# Patient Record
Sex: Male | Born: 1950 | Race: White | Hispanic: Yes | Marital: Married | Smoking: Current every day smoker
Health system: Southern US, Community
[De-identification: ages and names within clinical notes are randomized; demographics above are authoritative.]

## PROBLEM LIST (undated history)

## (undated) DIAGNOSIS — I10 Essential (primary) hypertension: Secondary | ICD-10-CM

## (undated) DIAGNOSIS — E119 Type 2 diabetes mellitus without complications: Secondary | ICD-10-CM

## (undated) HISTORY — PX: OTHER SURGICAL HISTORY: SHX169

## (undated) HISTORY — DX: Type 2 diabetes mellitus without complications: E11.9

## (undated) HISTORY — DX: Essential (primary) hypertension: I10

---

## 2009-09-01 ENCOUNTER — Ambulatory Visit (HOSPITAL_COMMUNITY): Payer: Self-pay | Admitting: Psychiatry

## 2009-10-13 ENCOUNTER — Ambulatory Visit (HOSPITAL_COMMUNITY): Payer: Self-pay | Admitting: Psychiatry

## 2010-05-01 ENCOUNTER — Ambulatory Visit (HOSPITAL_COMMUNITY): Payer: Self-pay | Admitting: Psychiatry

## 2010-07-30 ENCOUNTER — Encounter (HOSPITAL_COMMUNITY): Payer: Self-pay | Admitting: Psychiatry

## 2010-09-16 ENCOUNTER — Encounter (INDEPENDENT_AMBULATORY_CARE_PROVIDER_SITE_OTHER): Payer: Medicaid Other | Admitting: Psychiatry

## 2010-09-16 DIAGNOSIS — F319 Bipolar disorder, unspecified: Secondary | ICD-10-CM

## 2010-11-24 ENCOUNTER — Encounter (HOSPITAL_COMMUNITY): Payer: Medicaid Other | Admitting: Psychiatry

## 2011-01-05 ENCOUNTER — Encounter (INDEPENDENT_AMBULATORY_CARE_PROVIDER_SITE_OTHER): Payer: Medicaid Other | Admitting: Psychiatry

## 2011-01-05 DIAGNOSIS — F319 Bipolar disorder, unspecified: Secondary | ICD-10-CM

## 2011-03-08 ENCOUNTER — Encounter (HOSPITAL_COMMUNITY): Payer: Self-pay

## 2011-04-07 ENCOUNTER — Encounter (HOSPITAL_COMMUNITY): Payer: Medicaid Other | Admitting: Psychiatry

## 2011-04-23 ENCOUNTER — Ambulatory Visit (INDEPENDENT_AMBULATORY_CARE_PROVIDER_SITE_OTHER): Payer: Medicaid Other | Admitting: Psychiatry

## 2011-04-23 VITALS — BP 128/82 | Ht 65.0 in | Wt 154.0 lb

## 2011-04-23 DIAGNOSIS — F329 Major depressive disorder, single episode, unspecified: Secondary | ICD-10-CM
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
## 2011-05-30 ENCOUNTER — Other Ambulatory Visit (HOSPITAL_COMMUNITY): Payer: Self-pay | Admitting: Psychiatry

## 2011-07-23 ENCOUNTER — Ambulatory Visit (HOSPITAL_COMMUNITY): Payer: Self-pay | Admitting: Psychiatry

## 2011-09-28 ENCOUNTER — Other Ambulatory Visit (HOSPITAL_COMMUNITY): Payer: Self-pay | Admitting: Psychiatry

## 2011-10-07 ENCOUNTER — Other Ambulatory Visit (HOSPITAL_COMMUNITY): Payer: Self-pay | Admitting: Psychiatry

## 2011-10-23 ENCOUNTER — Other Ambulatory Visit (HOSPITAL_COMMUNITY): Payer: Self-pay | Admitting: Psychiatry

## 2011-10-26 ENCOUNTER — Encounter (HOSPITAL_COMMUNITY): Payer: Self-pay | Admitting: Psychiatry

## 2011-10-26 ENCOUNTER — Ambulatory Visit (INDEPENDENT_AMBULATORY_CARE_PROVIDER_SITE_OTHER): Payer: Medicaid Other | Admitting: Psychiatry

## 2011-10-26 VITALS — BP 122/84 | Ht 65.0 in | Wt 142.0 lb

## 2011-10-26 DIAGNOSIS — F331 Major depressive disorder, recurrent, moderate: Secondary | ICD-10-CM | POA: Insufficient documentation

## 2011-10-26 DIAGNOSIS — F329 Major depressive disorder, single episode, unspecified: Secondary | ICD-10-CM

## 2011-10-26 NOTE — Progress Notes (Signed)
   [facility] Behavioral Health Follow-up Outpatient Visit  Adeyemi Hamad 1951/01/11   Subjective: The patient is a 31 or 61 year old male who has been followed by [facility] since August of 2012. 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 I have not seen him since November. The patient reports that since his last appointment, hence he was referred to a new pain physician. He received injections in his back. It sounds like he had a reaction to it. The patient reports he's not able to keep food down. He's been vomiting a lot. He is actually down 12 pounds since I saw him 6 months ago. Patient has been referred to a new pain physician along with what sounds like a GI physician. They're planning on doing a colonoscopy. He reports that his 64 year old daughter returned to the house and took his 77-year-old grandchild. They're not allow contact. He reports that she said she only took her because the grandparents want her to much. His son is still in the household and still drinking. His 86 year old daughter is still acting out. He reports that neither one of them talked to him. The patient reports that recently when he gets sad or when he gets mad his head will shake. He states that other people have noticed it, but he and his wife never discussed it. He reports his been going on for about 4 or 5 months. He states that if he can calm himself down, his head will stop shaking. The patient is reportedly on Cymbalta and Vistaril from an outside physician. This only showed up in the records after patient had left his appointment. We discussed the possibility of an antidepressant, but the patient would like to wait. I'm relieved that he didn't since he is already on the Cymbalta. He reports that he is compliant with the Depakote. He's not sure if it's a medication that is making her mom.  Filed Vitals:   10/26/11 1444  BP: 122/84   Mental Status  Examination  Appearance: Casually dressed, very thin  Alert: Yes Attention: good  Cooperative: Yes Eye Contact: Good Speech: Soft with strong accent Psychomotor Activity: Decreased Memory/Concentration: Intact Oriented: person, place, time/date and situation Mood: Depressed Affect: Restricted Thought Processes and Associations: Logical Fund of Knowledge: Fair Thought Content: No suicidal or homicidal thoughts Insight: Fair Judgement: Fair  Diagnosis: Maj. depressive disorder, recurrent, moderate  Treatment Plan: At this point we will continue the patient on his Depakote ER. I will see him back in 2 months. Patient to update me on the results of his colonoscopy. I have offered him an interpreter. He feels that we're doing fine.  Jamse Mead, MD

## 2011-11-10 ENCOUNTER — Other Ambulatory Visit: Payer: Self-pay | Admitting: Sports Medicine

## 2011-11-10 ENCOUNTER — Ambulatory Visit
Admission: RE | Admit: 2011-11-10 | Discharge: 2011-11-10 | Disposition: A | Discharge status: Home / Self Care | Payer: Medicaid Other | Source: Ambulatory Visit | Attending: Sports Medicine | Admitting: Sports Medicine

## 2011-11-10 DIAGNOSIS — R52 Pain, unspecified: Secondary | ICD-10-CM

## 2011-11-25 ENCOUNTER — Other Ambulatory Visit (HOSPITAL_COMMUNITY): Payer: Self-pay | Admitting: Psychiatry

## 2011-12-28 ENCOUNTER — Ambulatory Visit (HOSPITAL_COMMUNITY): Payer: Self-pay | Admitting: Psychiatry

## 2012-01-26 ENCOUNTER — Ambulatory Visit (INDEPENDENT_AMBULATORY_CARE_PROVIDER_SITE_OTHER): Payer: Medicaid Other | Admitting: Psychiatry

## 2012-01-26 VITALS — BP 120/78 | Ht 65.0 in | Wt 144.0 lb

## 2012-01-26 DIAGNOSIS — F331 Major depressive disorder, recurrent, moderate: Secondary | ICD-10-CM

## 2012-01-26 DIAGNOSIS — F329 Major depressive disorder, single episode, unspecified: Secondary | ICD-10-CM

## 2012-01-27 ENCOUNTER — Encounter (HOSPITAL_COMMUNITY): Payer: Self-pay | Admitting: Psychiatry

## 2012-01-27 ENCOUNTER — Other Ambulatory Visit (HOSPITAL_COMMUNITY): Payer: Self-pay | Admitting: Psychiatry

## 2012-01-27 NOTE — Progress Notes (Signed)
   [facility] Behavioral Health Follow-up Outpatient Visit  Micheal Estrada [birth year]   Subjective: The patient is a  61 year old male who has been followed by [facility] since August of 2012. 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 At his last appointment, I did not make any changes. I did offer an antidepressant. The patient had been going to a hard time. He was not allowed to see his grandchild. There is a lot of discord at home with his son and his daughter. He was vomiting a lot, and in a lot of pain. He was down 12 pounds. After the appointment I discovered that he was actually on both Cymbalta and Vistaril through an outside provider. The patient presents today. He's been allowed to see his granddaughter again. His 71 year old daughter is exhibiting school refusal. They're looking at an alternative school. He is exhibiting less pain. His colonoscopy was normal. He feels that he is in a better state of mind. He does report that his Cymbalta was increased from 30 mg daily to 60 mg daily. This appears to be helping. He is consistent with the Depakote.  Filed Vitals:   01/27/12 0841  BP: 120/78   Mental Status Examination  Appearance: Casually dressed, very thin  Alert: Yes Attention: good  Cooperative: Yes Eye Contact: Good Speech: Soft with strong accent Psychomotor Activity: Decreased Memory/Concentration: Intact Oriented: person, place, time/date and situation Mood: Depressed Affect: Restricted Thought Processes and Associations: Logical Fund of Knowledge: Fair Thought Content: No suicidal or homicidal thoughts Insight: Fair Judgement: Fair  Diagnosis: Maj. depressive disorder, recurrent, moderate  Treatment Plan: At this point we will continue the patient on his Depakote ER. I will see him back in 3 months. I will continue his Cymbalta and Vistaril through an outside provider. I will see him back in 3  months.  Jamse Mead, MD

## 2012-04-04 DIAGNOSIS — R768 Other specified abnormal immunological findings in serum: Secondary | ICD-10-CM | POA: Insufficient documentation

## 2012-04-04 DIAGNOSIS — M069 Rheumatoid arthritis, unspecified: Secondary | ICD-10-CM | POA: Insufficient documentation

## 2012-04-06 DIAGNOSIS — G8929 Other chronic pain: Secondary | ICD-10-CM | POA: Insufficient documentation

## 2012-04-26 ENCOUNTER — Ambulatory Visit (HOSPITAL_COMMUNITY): Payer: Self-pay | Admitting: Psychiatry

## 2012-05-02 ENCOUNTER — Other Ambulatory Visit (HOSPITAL_COMMUNITY): Payer: Self-pay | Admitting: Psychiatry

## 2012-05-30 ENCOUNTER — Ambulatory Visit (HOSPITAL_COMMUNITY): Payer: Self-pay | Admitting: Psychiatry

## 2012-07-27 ENCOUNTER — Other Ambulatory Visit (HOSPITAL_COMMUNITY): Payer: Self-pay | Admitting: Psychiatry

## 2012-07-27 MED ORDER — DIVALPROEX SODIUM ER 500 MG PO TB24: ORAL_TABLET | ORAL | Status: DC

## 2012-08-28 ENCOUNTER — Other Ambulatory Visit (HOSPITAL_COMMUNITY): Payer: Self-pay | Admitting: Psychiatry

## 2012-09-19 ENCOUNTER — Other Ambulatory Visit (HOSPITAL_COMMUNITY): Payer: Self-pay | Admitting: Psychiatry

## 2012-12-19 ENCOUNTER — Ambulatory Visit (INDEPENDENT_AMBULATORY_CARE_PROVIDER_SITE_OTHER): Payer: Medicaid Other | Admitting: Psychiatry

## 2012-12-19 ENCOUNTER — Encounter (HOSPITAL_COMMUNITY): Payer: Self-pay | Admitting: Psychiatry

## 2012-12-19 VITALS — BP 122/82 | Ht 65.0 in | Wt 150.0 lb

## 2012-12-19 DIAGNOSIS — F329 Major depressive disorder, single episode, unspecified: Secondary | ICD-10-CM

## 2012-12-19 DIAGNOSIS — F331 Major depressive disorder, recurrent, moderate: Secondary | ICD-10-CM

## 2012-12-19 MED ORDER — DIVALPROEX SODIUM ER 500 MG PO TB24: ORAL_TABLET | ORAL | Status: DC

## 2012-12-19 NOTE — Progress Notes (Signed)
   [facility] Behavioral Health Follow-up Outpatient Visit  Micheal Estrada 12/26/1950   Subjective: The patient is a  62 year old male who has been followed by [facility] since August of 2012. 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 At his last appointment, I did not make any changes. He presents today after not being seen since September of 2013. He reports that his wife makes his appointments, and sometimes does not feel like taking him to his appointments. She is the only one who helps him. His daughter continues to miss behavior. She just turned 16 last week. She has been moving in with periods management different ages. She has had a sexual transmitted disease. His son had moved out, but lost his job and is now back. He feels the son is worse than ever. They do get to see their 88-year-old granddaughter. The patient reports that he's continues to take his Depakote. He has a lot of chronic pain. He does see an arthritis doctor. Patient is complaining about pain in his right lower quadrant and left upper quadrant. He feels that his depression is doing okay. He endorses good sleep and appetite. He is up 6 pounds today.  Filed Vitals:   12/19/12 1415  BP: 122/82   Active Ambulatory Problems    Diagnosis Date Noted  . MDD (major depressive disorder) 10/26/2011   Resolved Ambulatory Problems    Diagnosis Date Noted  . No Resolved Ambulatory Problems   No Additional Past Medical History   Current Outpatient Prescriptions on File Prior to Visit  Medication Sig Dispense Refill  . CYMBALTA 30 MG capsule       . hydrOXYzine (ATARAX/VISTARIL) 50 MG tablet       . JANUVIA 100 MG tablet       . lisinopril (PRINIVIL,ZESTRIL) 5 MG tablet       . lovastatin (MEVACOR) 20 MG tablet       . metFORMIN (GLUCOPHAGE-XR) 500 MG 24 hr tablet       . methotrexate (RHEUMATREX) 2.5 MG tablet       . omeprazole (PRILOSEC) 40 MG capsule       .  oxyCODONE-acetaminophen (PERCOCET) 10-325 MG per tablet       . polyethylene glycol powder (GLYCOLAX/MIRALAX) powder       . predniSONE (DELTASONE) 5 MG tablet       . TRILIPIX 135 MG capsule        No current facility-administered medications on file prior to visit.    Mental Status Examination  Appearance: Casually dressed, very thin  Alert: Yes Attention: good  Cooperative: Yes Eye Contact: Good Speech: Soft with strong accent Psychomotor Activity: Decreased Memory/Concentration: Intact Oriented: person, place, time/date and situation Mood: Depressed Affect: Restricted Thought Processes and Associations: Logical Fund of Knowledge: Fair Thought Content: No suicidal or homicidal thoughts Insight: Fair Judgement: Fair  Diagnosis: Maj. depressive disorder, recurrent, moderate  Treatment Plan: At this point I will continue the patient on his Depakote ER. I will see him back in 3 months. He will continue his Cymbalta and Vistaril through an outside provider.   Jamse Mead, MD

## 2013-01-29 DIAGNOSIS — E119 Type 2 diabetes mellitus without complications: Secondary | ICD-10-CM | POA: Insufficient documentation

## 2013-01-29 DIAGNOSIS — I10 Essential (primary) hypertension: Secondary | ICD-10-CM | POA: Insufficient documentation

## 2013-02-15 DIAGNOSIS — G250 Essential tremor: Secondary | ICD-10-CM | POA: Insufficient documentation

## 2013-03-09 ENCOUNTER — Other Ambulatory Visit (HOSPITAL_COMMUNITY): Payer: Self-pay | Admitting: Psychiatry

## 2013-03-21 ENCOUNTER — Encounter (HOSPITAL_COMMUNITY): Payer: Self-pay | Admitting: Psychiatry

## 2013-03-21 ENCOUNTER — Ambulatory Visit (INDEPENDENT_AMBULATORY_CARE_PROVIDER_SITE_OTHER): Payer: Medicare Other | Admitting: Psychiatry

## 2013-03-21 VITALS — BP 112/78 | Ht 65.0 in | Wt 145.0 lb

## 2013-03-21 DIAGNOSIS — F331 Major depressive disorder, recurrent, moderate: Secondary | ICD-10-CM
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
## 2013-04-03 ENCOUNTER — Telehealth (HOSPITAL_COMMUNITY): Payer: Self-pay

## 2013-04-03 DIAGNOSIS — F322 Major depressive disorder, single episode, severe without psychotic features: Secondary | ICD-10-CM

## 2013-04-03 NOTE — Telephone Encounter (Signed)
The patient's spouse reports that the patient has been doing well on Valproic Acid but was sent to another provider who felt depakote may be causing the hand tremors.    PLAN: Asked patient to come to clinic to do lab work in AM. Will leave lab slip at reception.

## 2013-04-04 LAB — COMPLETE METABOLIC PANEL WITH GFR
AST: 26 U/L (ref 0–37)
BUN: 16 mg/dL (ref 6–23)
CO2: 26 mEq/L (ref 19–32)
Calcium: 9.3 mg/dL (ref 8.4–10.5)
Chloride: 104 mEq/L (ref 96–112)
Creat: 1.17 mg/dL (ref 0.50–1.35)
GFR, Est African American: 78 mL/min
GFR, Est Non African American: 68 mL/min
Glucose, Bld: 90 mg/dL (ref 70–99)

## 2013-04-04 LAB — CBC WITH DIFFERENTIAL/PLATELET
Basophils Absolute: 0 10*3/uL (ref 0.0–0.1)
Lymphocytes Relative: 52 % — ABNORMAL HIGH (ref 12–46)
Lymphs Abs: 4.5 10*3/uL — ABNORMAL HIGH (ref 0.7–4.0)
Neutrophils Relative %: 41 % — ABNORMAL LOW (ref 43–77)
Platelets: 267 10*3/uL (ref 150–400)
RBC: 4.01 MIL/uL — ABNORMAL LOW (ref 4.22–5.81)
WBC: 8.6 10*3/uL (ref 4.0–10.5)

## 2013-04-06 ENCOUNTER — Telehealth (HOSPITAL_COMMUNITY): Payer: Self-pay | Admitting: Psychiatry

## 2013-04-06 NOTE — Telephone Encounter (Signed)
Will send lab work to Dr. Quinn Axe.

## 2013-04-06 NOTE — Telephone Encounter (Signed)
The patient continues to have problems with his hand and has problems with dropping things for the past two months. The patient has been doing well on depakote regarding depression at his current dose, his wife reports he has been taking his current dose of Depakote since his June 2014 and has been doing well.

## 2013-05-21 ENCOUNTER — Encounter (HOSPITAL_COMMUNITY): Payer: Self-pay | Admitting: Psychiatry

## 2013-05-21 ENCOUNTER — Ambulatory Visit (INDEPENDENT_AMBULATORY_CARE_PROVIDER_SITE_OTHER): Payer: Medicare Other | Admitting: Psychiatry

## 2013-05-21 VITALS — BP 122/84 | HR 105 | Wt 159.0 lb

## 2013-05-21 DIAGNOSIS — F331 Major depressive disorder, recurrent, moderate: Secondary | ICD-10-CM

## 2013-05-21 MED ORDER — DIVALPROEX SODIUM ER 500 MG PO TB24: ORAL_TABLET | ORAL | Status: DC

## 2013-05-21 NOTE — Progress Notes (Signed)
[facility] Behavioral Health [id number] Progress Note  Micheal Estrada [id number] 62 y.o.  05/21/2013 1:49 PM  Chief Complaint:  HPI Comments: Micheal Estrada is  a 62 y/o male with a past psychiatric history significant for Major Depressive Disorder. The patient is referred for psychiatric services for medication management.    . Location: The patient reports he has been doing well. The patients wife reports that patient has been doing better since his last appointment. . Quality: The patient reports that his main stressors are:  "health"  In the area of affective symptoms, patient appears mildly anxious. Patient denies current suicidal ideation, intent, or plan. Patient denies current homicidal ideation, intent, or plan. Patient denies auditory hallucinations. Patient denies visual hallucinations. Patient endorses symptoms of paranoia. Patient states sleep is fair but will wake up after 1-3 hours due to pain. Appetite is good. Energy level is low. Patient endorses symptoms of anhedonia. Patient endorses hopelessness, helplessness, and guilt.   Called patient's wife with the patient's verbal consent. Collateral information from the patient's wife reveals mood instability, particularly anger when the patient had been taken off Depakote in the past of if the dose had been decreased.  . Severity: Moderate to severe  . Duration: More than 5 years ago  . Timing: Throughout the day  . Context: Health related issues  . Modifying factors: None  . Associated signs and symptoms:  As noted in Psych: Review of systems.   History of Present Illness: Suicidal Ideation: No Plan Formed: No Patient has means to carry out plan: No  Homicidal Ideation: No Plan Formed: No Patient has means to carry out plan: No  Review of Systems: Psychiatric: Agitation: Negative Hallucination: Negative Depressed Mood: Negative Insomnia: Negative Hypersomnia: Negative Altered Concentration: Yes Feels  Worthless: Negative Grandiose Ideas: Negative Belief In Special Powers: Negative New/Increased Substance Abuse: Negative Compulsions: Negative  Neurologic: Headache: Negative Seizure: Negative Paresthesias: Negative  Past Medical Family, Social History:  Past Medical History  Diagnosis Date  . Diabetes mellitus, type II   . Hypertension       Outpatient Encounter Prescriptions as of 05/21/2013  Medication Sig  . amitriptyline (ELAVIL) 50 MG tablet   . CELEBREX 200 MG capsule   . cyclobenzaprine (FLEXERIL) 10 MG tablet   . CYMBALTA 30 MG capsule   . divalproex (DEPAKOTE ER) 500 MG 24 hr tablet take 2 tablets by mouth at bedtime  . fenofibrate 160 MG tablet   . Hydrocodone-Acetaminophen 5-300 MG TABS   . hydrOXYzine (ATARAX/VISTARIL) 50 MG tablet   . JANUVIA 100 MG tablet   . lisinopril (PRINIVIL,ZESTRIL) 5 MG tablet   . lovastatin (MEVACOR) 20 MG tablet   . metFORMIN (GLUCOPHAGE-XR) 500 MG 24 hr tablet   . methotrexate (RHEUMATREX) 2.5 MG tablet   . omeprazole (PRILOSEC) 40 MG capsule   . oxyCODONE-acetaminophen (PERCOCET) 10-325 MG per tablet   . polyethylene glycol powder (GLYCOLAX/MIRALAX) powder   . predniSONE (DELTASONE) 5 MG tablet   . PREVIDENT 5000 BOOSTER PLUS 1.1 % PSTE   . primidone (MYSOLINE) 50 MG tablet   . PROAIR HFA 108 (90 BASE) MCG/ACT inhaler   . SPIRIVA HANDIHALER 18 MCG inhalation capsule   . TRILIPIX 135 MG capsule     Past Psychiatric History/Hospitalization(s): Anxiety: Negative Bipolar Disorder: Negative Depression: Yes Mania: Negative Psychosis: Negative Schizophrenia: Negative Personality Disorder: Negative Hospitalization for psychiatric illness: Yes History of Electroconvulsive Shock Therapy: Negative Prior Suicide Attempts: No  Physical Exam: Constitutional:  BP 122/84  Pulse 105  Wt 159 lb (72.122 kg)  General Appearance: alert, oriented, no acute distress and well nourished  Musculoskeletal: Strength & Muscle Tone:  within normal limits Gait & Station: normal Patient leans: N/A  Psychiatric: General Appearance: Casual and Well Groomed  Patent attorney::  Poor  Speech:  Slow  Volume:  Normal  Mood:  Anxious  Affect:  Blunt  Thought Process:  Coherent, Linear and Logical  Orientation:  Full (Time, Place, and Person)  Thought Content:  WDL  Suicidal Thoughts:  No  Homicidal Thoughts:  No  Memory:  Immediate;   Good Recent;   Poor Remote;   Fair  Judgement:  Fair  Insight:  Negative and Fair  Psychomotor Activity:  Normal  Concentration:  Fair  Recall:  Poor  Akathisia:  Negative  Handed:  Right  AIMS (if indicated):     Assets:  Desire for Improvement Financial Resources/Insurance Housing Resilience Social Music therapist (Choose Three): Established Problem, Stable/Improving (1), Review of Psycho-Social Stressors (1), Review and summation of old records (2), Review of Medication Regimen & Side Effects (2) and Review of New Medication or Change in Dosage (2)  Assessment: Axis I:     Plan:   Plan of Care:  PLAN:  1. Affirm with the patient that the medications are taken as ordered. Patient  expressed understanding of how their medications were to be used.    Laboratory: Labs reviewed. No labs warranted at this time.   Psychotherapy: Therapy: brief supportive therapy provided.  Discussed psychosocial stressors in detail.    Medications:  Continue the following psychiatric medications as written prior to this appointment with the following changes::  a) depakote-ER 500 mg 2 tablets at bedtime.  -Risks and benefits, side effects and alternatives discussed with patient, he was given an opportunity to ask questions about his medication, illness, and treatment. All current psychiatric medications have been reviewed and discussed with the patient and adjusted as clinically appropriate. The patient has been provided an accurate and updated list of the  medications being now prescribed.   Routine PRN Medications:  Negative  Consultations: The patient was encouraged to keep all PCP and specialty clinic appointments.   Safety Concerns:   Patient told to call clinic if any problems occur. Patient advised to go to  ER  if he should develop SI/HI, side effects, or if symptoms worsen. Has crisis numbers to call if needed.    Other:   8. Patient was instructed to return to clinic in 1 months.  9. The patient was advised to call and cancel their mental health appointment within 24 hours of the appointment, if they are unable to keep the appointment, as well as the three no show and termination from clinic policy. 10. The patient expressed understanding of the plan and agrees with the above.  Time Spent: 25 minutes  Jacqulyn Cane, M.D.  05/21/2013 1:49 PM

## 2013-05-24 IMAGING — CR DG THORACIC SPINE 2V
3 series · 3 of 3 positions shown · non-contrast
Comparison: None.

CLINICAL DATA: Mid to low back pain for many years, no recent
injury

THORACIC SPINE - 2 VIEW

[view not recorded (1 of 3)]
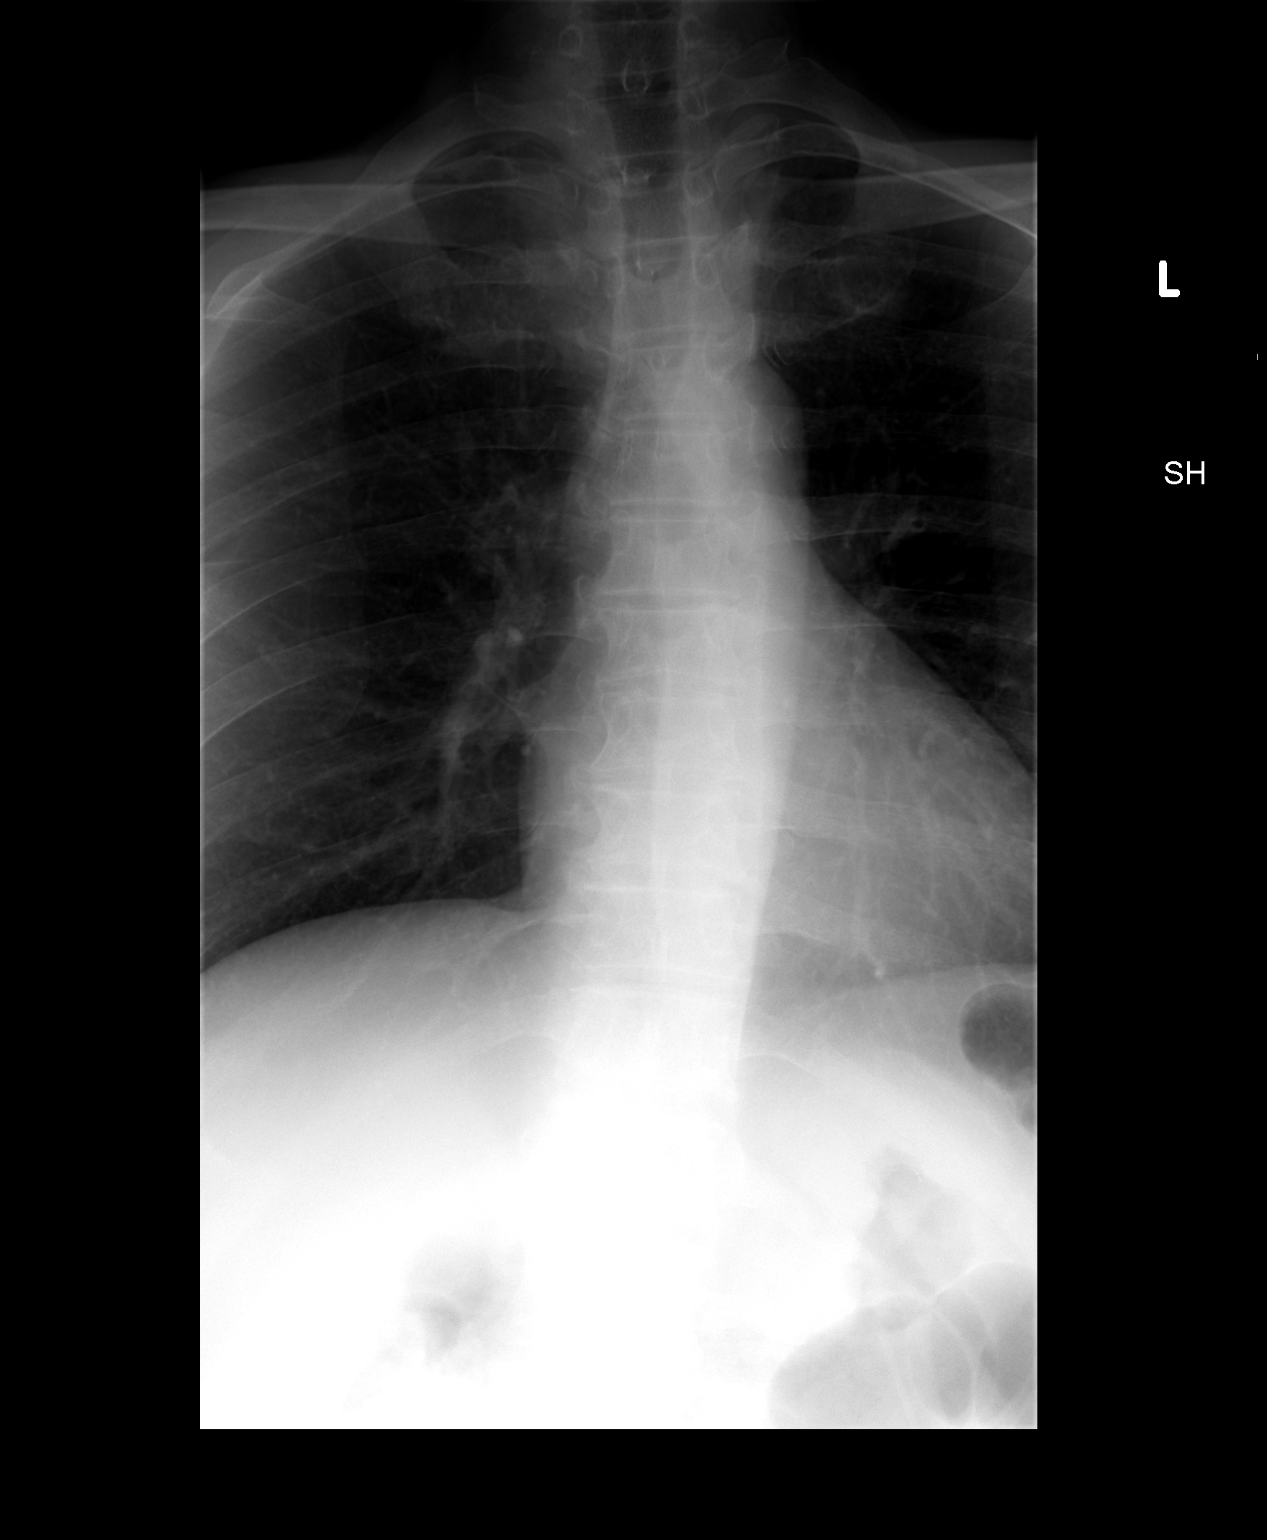

[view not recorded (2 of 3)]
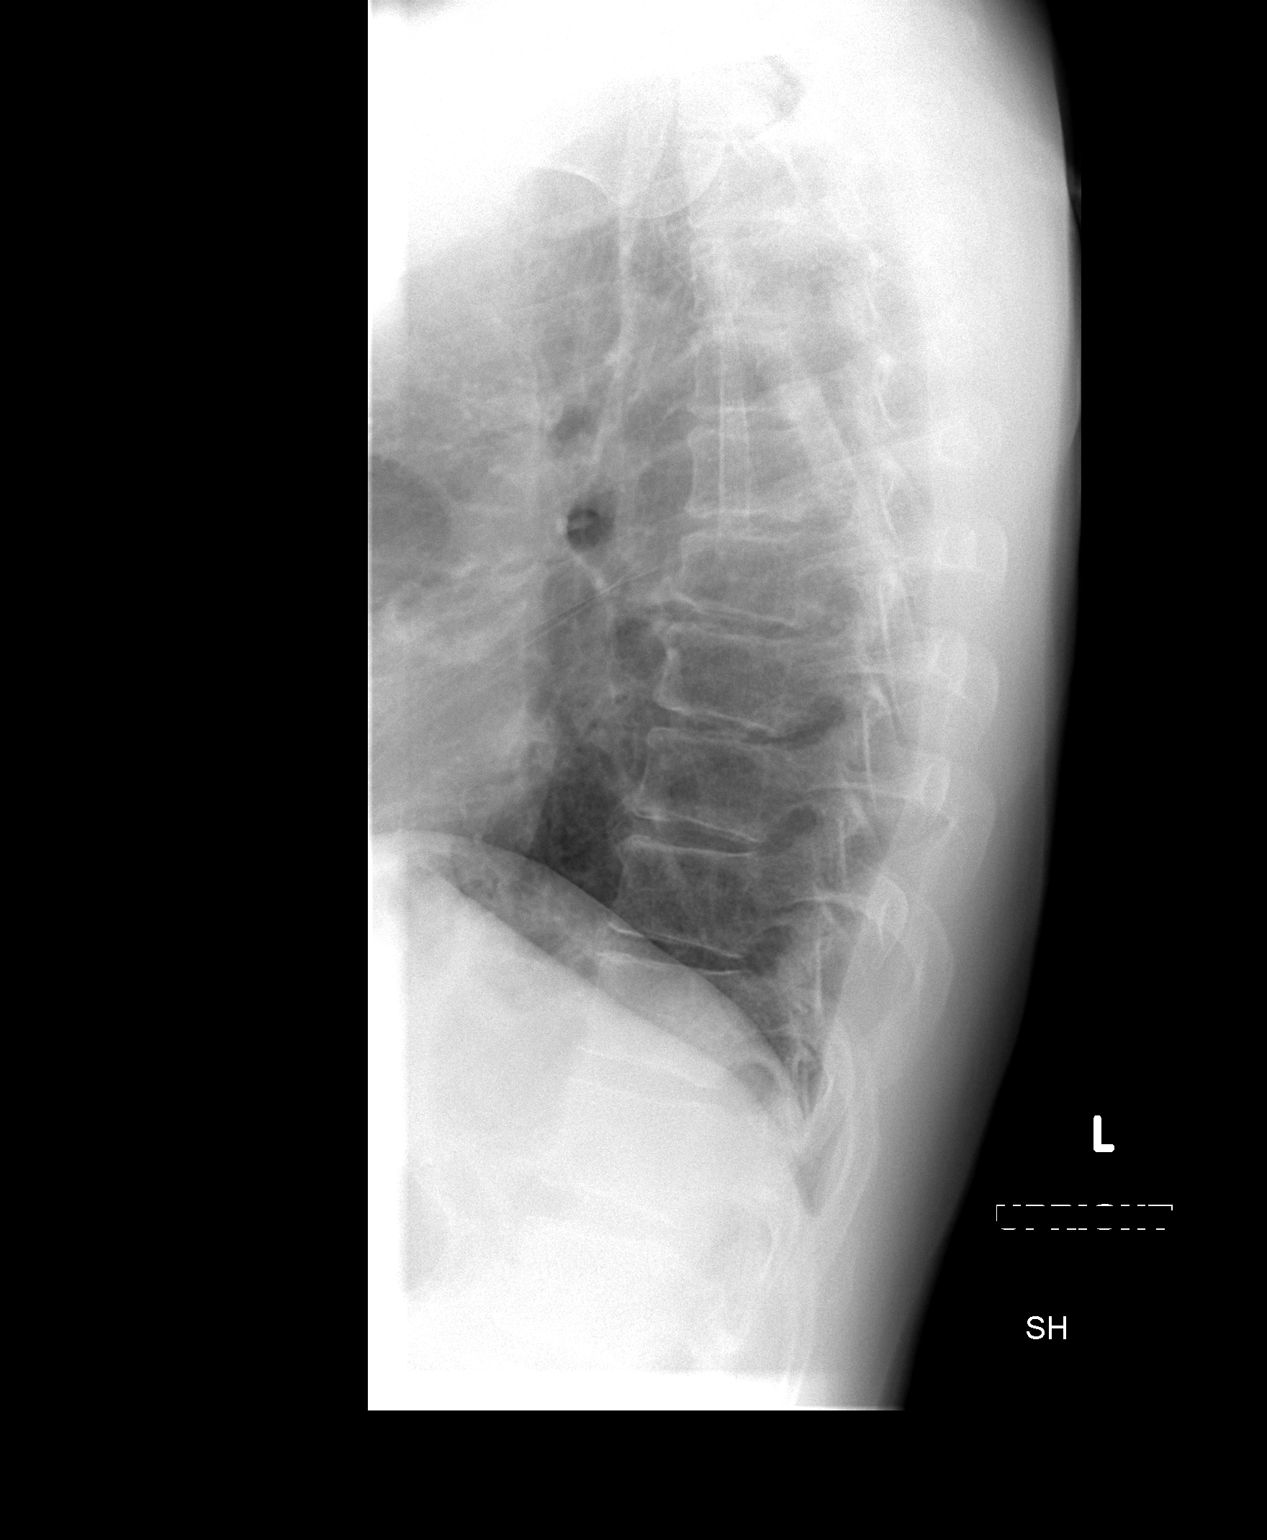

[view not recorded (3 of 3)]
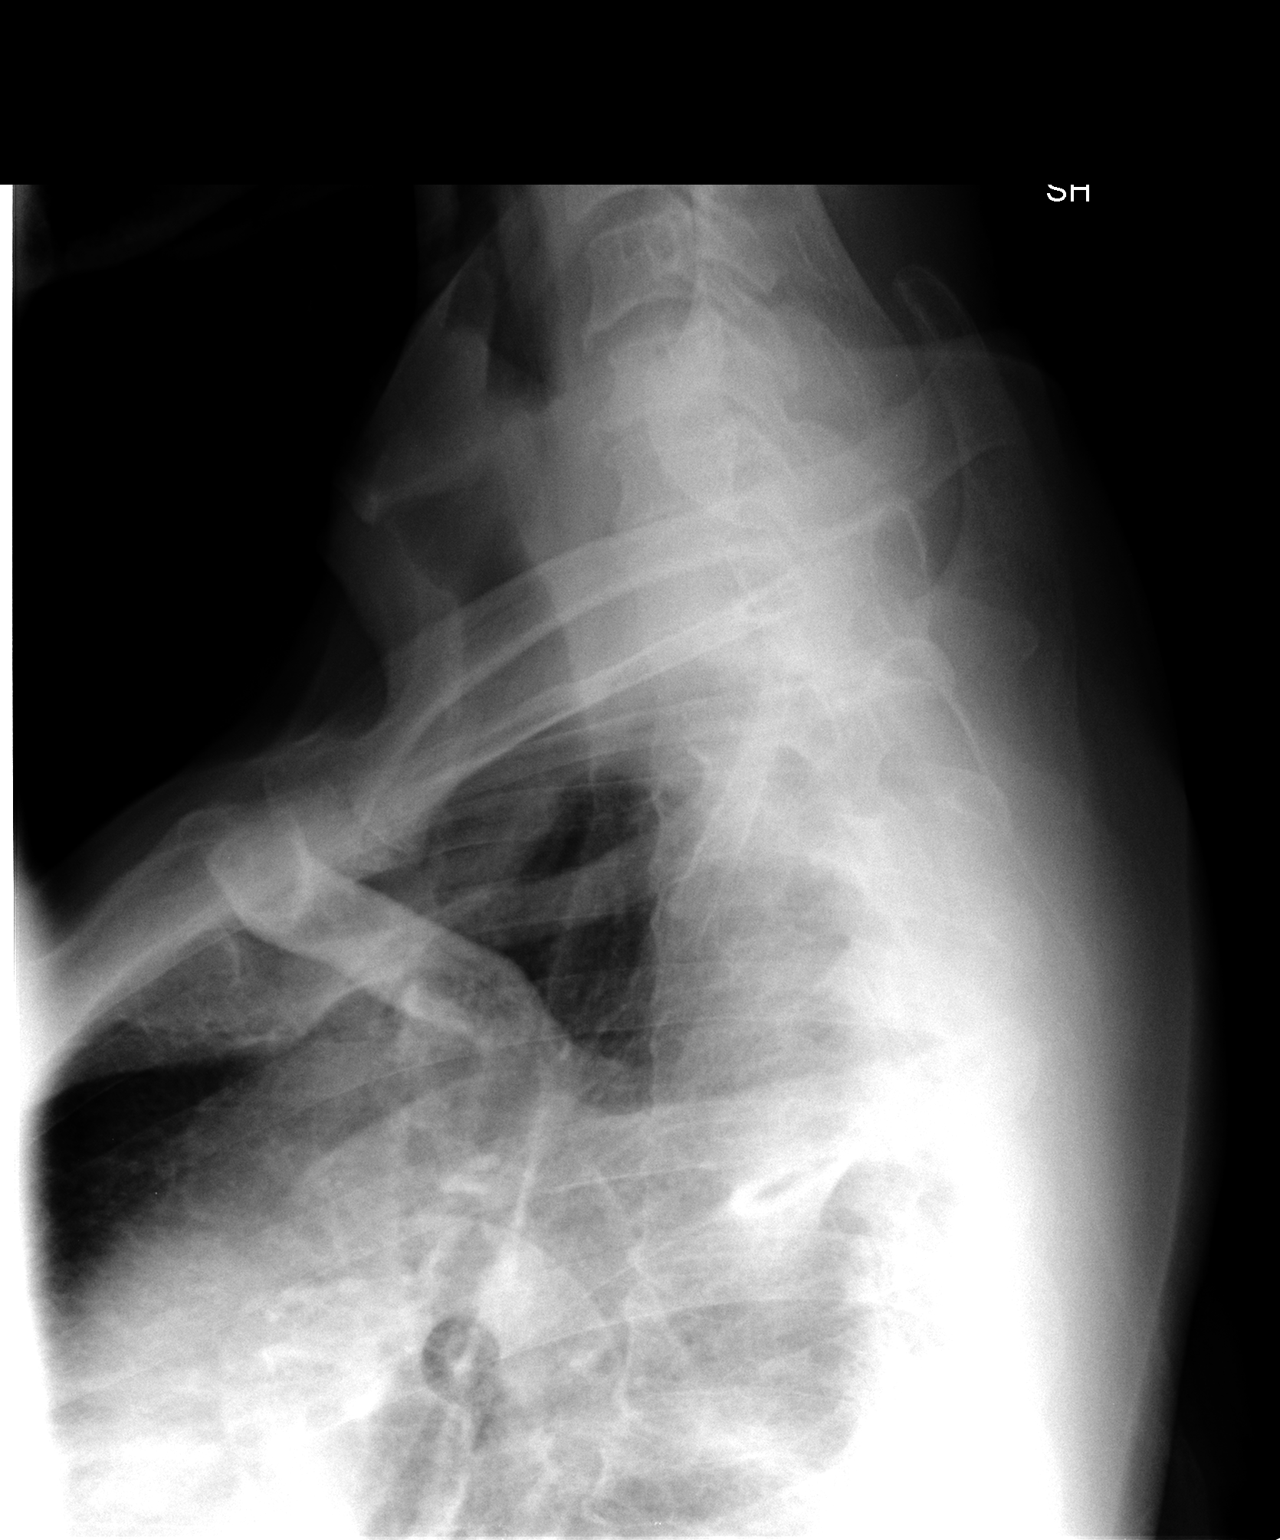

[3 of 3 positions shown; findings below may reference images not displayed]

FINDINGS: The thoracic vertebrae are in normal alignment with only
minimal degenerative change present. No compression deformity is
seen.  No paravertebral soft tissue swelling is seen.
IMPRESSION: Mild degenerative change.  No compression deformity.

## 2013-08-21 ENCOUNTER — Ambulatory Visit (HOSPITAL_COMMUNITY): Payer: Self-pay | Admitting: Psychiatry

## 2013-09-04 ENCOUNTER — Other Ambulatory Visit (HOSPITAL_COMMUNITY): Payer: Self-pay | Admitting: Psychiatry

## 2013-09-05 NOTE — Telephone Encounter (Signed)
Refilled patient's depakote.

## 2013-09-20 DIAGNOSIS — E559 Vitamin D deficiency, unspecified: Secondary | ICD-10-CM | POA: Insufficient documentation

## 2013-10-02 DIAGNOSIS — D509 Iron deficiency anemia, unspecified: Secondary | ICD-10-CM | POA: Insufficient documentation

## 2014-01-01 ENCOUNTER — Encounter (INDEPENDENT_AMBULATORY_CARE_PROVIDER_SITE_OTHER): Payer: Self-pay

## 2014-01-01 ENCOUNTER — Ambulatory Visit (INDEPENDENT_AMBULATORY_CARE_PROVIDER_SITE_OTHER): Payer: Medicare Other | Admitting: Psychiatry

## 2014-01-01 DIAGNOSIS — M549 Dorsalgia, unspecified: Secondary | ICD-10-CM | POA: Insufficient documentation

## 2014-01-01 DIAGNOSIS — F0630 Mood disorder due to known physiological condition, unspecified: Secondary | ICD-10-CM

## 2014-01-01 DIAGNOSIS — F331 Major depressive disorder, recurrent, moderate: Secondary | ICD-10-CM

## 2014-01-01 MED ORDER — DIVALPROEX SODIUM ER 500 MG PO TB24: 1000.0000 mg | ORAL_TABLET | Freq: Every day | ORAL | Status: DC

## 2014-01-01 NOTE — Progress Notes (Signed)
Patient ID: Micheal Estrada, male   DOB: [birth year], 63 y.o.   MRN: [id number]  [facility] [id number] Progress Note  Micheal Estrada [id number] 63 y.o.  01/01/2014 2:10 PM  Chief Complaint:  HPI Comments: Micheal Estrada is  a 63 y/o male with a past psychiatric history significant for Major Depressive Disorder. The patient is referred for psychiatric services for medication management.    . Location: The patient reports he has been doing well. The patients wife reports that patient has been doing better since his last appointment. . Quality: The patient reports that his main stressors are:  "health"  And also worries about her daughter. She ran away and lives with boyfriend.  In the area of affective symptoms, patient appears mildly anxious. Patient denies current suicidal ideation, intent, or plan. Patient denies current homicidal ideation, intent, or plan. Patient denies auditory hallucinations. Patient denies visual hallucinations. Patient endorses symptoms of paranoia. Patient states sleep is fair but will wake up after 1-3 hours due to pain. Appetite is good. Energy level is low. Patient endorses symptoms of anhedonia. Patient endorses hopelessness, helplessness, and guilt.   Patient feels comfortable with the current dose. Does not want to change her lower the dose. Says without this medication Depakote he starts feeling down depressed and withdraws last Depakote level is 74 . He keeps himself busy at home does not endorse hopelessness or any significant mood swings is not using drugs or alcohol he worries about his physical health he is back condition multiple back injuries on he's also on pain medication.,  diabetic medications  . Severity: Moderate to severe  . Duration: More than 5 years ago  . Timing: Throughout the day. More so during night  . Context: Health related issues  . Modifying factors: None  . Associated signs and symptoms:  As noted in  Psych: Review of systems.   History of Present Illness: Suicidal Ideation: No Plan Formed: No Patient has means to carry out plan: No  Homicidal Ideation: No Plan Formed: No Patient has means to carry out plan: No  Review of Systems: Psychiatric: Agitation: Negative Hallucination: Negative Depressed Mood: Negative Insomnia: Negative Hypersomnia: Negative Altered Concentration: Yes Feels Worthless: Negative Grandiose Ideas: Negative Belief In Special Powers: Negative New/Increased Substance Abuse: Negative Compulsions: Negative  Neurologic: Headache: Negative Seizure: Negative Paresthesias: Negative  Past Medical Family, Social History:  Past Medical History  Diagnosis Date  . Diabetes mellitus, type II   . Hypertension       Outpatient Encounter Prescriptions as of 01/01/2014  Medication Sig  . amitriptyline (ELAVIL) 50 MG tablet   . CELEBREX 200 MG capsule   . cyclobenzaprine (FLEXERIL) 10 MG tablet   . CYMBALTA 30 MG capsule   . divalproex (DEPAKOTE ER) 500 MG 24 hr tablet Take 2 tablets (1,000 mg total) by mouth at bedtime.  Elgie Collard SURECLICK 50 MG/ML injection   . fenofibrate 160 MG tablet   . furosemide (LASIX) 20 MG tablet   . Hydrocodone-Acetaminophen 5-300 MG TABS   . hydrOXYzine (ATARAX/VISTARIL) 50 MG tablet Take 50 mg by mouth at bedtime as needed.   Marland Kitchen JANUVIA 100 MG tablet   . lisinopril (PRINIVIL,ZESTRIL) 5 MG tablet   . lovastatin (MEVACOR) 20 MG tablet   . metFORMIN (GLUCOPHAGE-XR) 500 MG 24 hr tablet   . methotrexate (RHEUMATREX) 2.5 MG tablet   . omeprazole (PRILOSEC) 40 MG capsule   . oxyCODONE-acetaminophen (PERCOCET) 10-325 MG per tablet   . polyethylene glycol powder (  GLYCOLAX/MIRALAX) powder   . predniSONE (DELTASONE) 5 MG tablet   . PREVIDENT 5000 BOOSTER PLUS 1.1 % PSTE   . primidone (MYSOLINE) 50 MG tablet   . PROAIR HFA 108 (90 BASE) MCG/ACT inhaler   . SPIRIVA HANDIHALER 18 MCG inhalation capsule   . TRILIPIX 135 MG capsule   .  [DISCONTINUED] divalproex (DEPAKOTE ER) 500 MG 24 hr tablet take 2 tablets by mouth at bedtime      Physical Exam: Constitutional:  There were no vitals taken for this visit.  General Appearance: alert, oriented, no acute distress and well nourished  Musculoskeletal: Strength & Muscle Tone: within normal limits Gait & Station: normal Patient leans: N/A  Psychiatric: General Appearance: Casual and Well Groomed  Patent attorneyye Contact::  Poor  Speech:  Slow  Volume:  Normal  Mood:  Anxious  Affect:  Blunt  Thought Process:  Coherent, Linear and Logical  Orientation:  Full (Time, Place, and Person)  Thought Content:  WDL  Suicidal Thoughts:  No  Homicidal Thoughts:  No  Memory:  Immediate;   Good Recent;   Poor Remote;   Fair  Judgement:  Fair  Insight:  Negative and Fair  Psychomotor Activity:  Normal  Concentration:  Fair  Recall:  Poor  Akathisia:  Negative  Handed:  Right  AIMS (if indicated):     Assets:  Desire for Improvement Financial Resources/Insurance Housing Resilience Social Support Transportation      Assessment: Axis I: 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    Plan:   Plan of Care:  PLAN:  1. Affirm with the patient that the medications are taken as ordered. Patient  expressed understanding of how their medications were to be used.    Laboratory: Labs reviewed. No labs warranted at this time.   Psychotherapy: Therapy: brief supportive therapy provided.  Discussed psychosocial stressors in detail.    Medications:  Continue the following psychiatric medications as written prior to this appointment with the following changes::  a) depakote-ER 500 mg 2 tablets at bedtime. Will write down for depakote level to be done in morning.  -Risks and benefits, side effects and alternatives discussed with patient, he was given an opportunity to ask questions about his medication, illness, and treatment.  All current psychiatric medications have been reviewed and discussed with the patient and adjusted as clinically appropriate. The patient has been provided an accurate and updated list of the medications being now prescribed.   Routine PRN Medications:  Negative  Consultations: The patient was encouraged to keep all PCP and specialty clinic appointments.   Safety Concerns:   Patient told to call clinic if any problems occur. Patient advised to go to  ER  if he should develop SI/HI, side effects, or if symptoms worsen. Has crisis numbers to call if needed.    Other:   8. Patient was instructed to return to clinic in 2 months.  9. The patient was advised to call and cancel their mental health appointment within 24 hours of the appointment, if they are unable to keep the appointment, as well as the three no show and termination from clinic policy. 10. The patient expressed understanding of the plan and agrees with the above.  Time Spent: 25 minutes  Thresa RossNADEEM Calyx Hawker, M.D.  01/01/2014 2:10 PM

## 2014-01-02 LAB — VALPROIC ACID LEVEL: Valproic Acid Lvl: 62.7 ug/mL (ref 50.0–100.0)

## 2014-03-04 ENCOUNTER — Ambulatory Visit (HOSPITAL_COMMUNITY): Payer: Self-pay | Admitting: Psychiatry

## 2014-03-11 ENCOUNTER — Other Ambulatory Visit (HOSPITAL_COMMUNITY): Payer: Self-pay | Admitting: Psychiatry

## 2014-04-30 ENCOUNTER — Other Ambulatory Visit (HOSPITAL_COMMUNITY): Payer: Self-pay | Admitting: Psychiatry

## 2014-06-03 ENCOUNTER — Other Ambulatory Visit (HOSPITAL_COMMUNITY): Payer: Self-pay | Admitting: Psychiatry

## 2014-07-18 ENCOUNTER — Ambulatory Visit (INDEPENDENT_AMBULATORY_CARE_PROVIDER_SITE_OTHER): Payer: 59 | Admitting: Psychiatry

## 2014-07-18 ENCOUNTER — Encounter (HOSPITAL_COMMUNITY): Payer: Self-pay | Admitting: Psychiatry

## 2014-07-18 ENCOUNTER — Encounter (INDEPENDENT_AMBULATORY_CARE_PROVIDER_SITE_OTHER): Payer: Self-pay

## 2014-07-18 VITALS — BP 128/81 | HR 100 | Ht 65.0 in | Wt 153.0 lb

## 2014-07-18 DIAGNOSIS — F331 Major depressive disorder, recurrent, moderate: Secondary | ICD-10-CM | POA: Diagnosis not present

## 2014-07-18 DIAGNOSIS — F0630 Mood disorder due to known physiological condition, unspecified: Secondary | ICD-10-CM | POA: Diagnosis not present

## 2014-07-18 DIAGNOSIS — F411 Generalized anxiety disorder: Secondary | ICD-10-CM

## 2014-07-18 MED ORDER — DIVALPROEX SODIUM ER 500 MG PO TB24: 1000.0000 mg | ORAL_TABLET | Freq: Every day | ORAL | Status: DC

## 2014-07-18 MED ORDER — ESCITALOPRAM OXALATE 5 MG PO TABS: 20.0000 mg | ORAL_TABLET | Freq: Every day | ORAL | Status: DC

## 2014-07-18 NOTE — Progress Notes (Signed)
Patient ID: Rodman Picklelejandro Carrillo Lento, male   DOB: 1950-11-16, 64 y.o.   MRN: [id number]   [facility] [id number] Progress Note  Rodman Picklelejandro Carrillo Wilcock [id number] 64 y.o.  07/18/2014 10:36 AM  Chief Complaint:  HPI Comments: [name] is  a 64 y/o male with a past psychiatric history significant for Major Depressive Disorder. 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 The patient is referred for psychiatric services for medication management.    Patient remains concerned about her daughter who is 62 years of age but does not come home for weeks. Also concern about his son who is drinking. He does have a supportive wife. He is feeling down not hopeless but talked about withdrawn and depression. He is not sure if he is taking Cymbalta also I called the pharmacy he is not taking for the last 3 months. Also Elavil has not been prescribed for the last 2-3 months.  Overall history taking Depakote trust to keep himself busy but endorses feeling of down and depression which is episodic and related to the worries off his family members.  He keeps himself busy at home does not endorse hopelessness or any significant mood swings is not using drugs or alcohol he worries about his physical health he is back condition multiple back injuries on he's also on pain medication.,  diabetic medications  . Severity: Moderate to severe  . Duration: More than 5 years ago  . Timing: Throughout the day. More so during night  . Context: Health related issues.   . Modifying factors: None  . Associated signs and symptoms:  As noted in Psych: Review of systems.   History of Present Illness: Suicidal Ideation: No Plan Formed: No Patient has means to carry out plan: No  Homicidal Ideation: No Plan Formed: No Patient has means to carry out plan: No  Review of Systems: Psychiatric: Agitation: Negative Hallucination: Negative Depressed Mood: Negative Insomnia: Negative Hypersomnia:  Negative Altered Concentration: Yes Feels Worthless: Negative Grandiose Ideas: Negative Belief In Special Powers: Negative New/Increased Substance Abuse: Negative Compulsions: Negative  Neurologic: Headache: Negative Seizure: Negative Paresthesias: Negative  Past Medical Family, Social History:  Past Medical History  Diagnosis Date  . Diabetes mellitus, type II   . Hypertension       Outpatient Encounter Prescriptions as of 07/18/2014  Medication Sig  . CELEBREX 200 MG capsule   . cyclobenzaprine (FLEXERIL) 10 MG tablet   . divalproex (DEPAKOTE ER) 500 MG 24 hr tablet Take 2 tablets (1,000 mg total) by mouth at bedtime.  Elgie Collard. ENBREL SURECLICK 50 MG/ML injection   . fenofibrate 160 MG tablet   . furosemide (LASIX) 20 MG tablet   . Hydrocodone-Acetaminophen 5-300 MG TABS   . hydrOXYzine (ATARAX/VISTARIL) 50 MG tablet Take 50 mg by mouth at bedtime as needed.   Marland Kitchen. JANUVIA 100 MG tablet   . lisinopril (PRINIVIL,ZESTRIL) 5 MG tablet   . lovastatin (MEVACOR) 20 MG tablet   . metFORMIN (GLUCOPHAGE-XR) 500 MG 24 hr tablet   . methotrexate (RHEUMATREX) 2.5 MG tablet   . omeprazole (PRILOSEC) 40 MG capsule   . oxyCODONE-acetaminophen (PERCOCET) 10-325 MG per tablet   . polyethylene glycol powder (GLYCOLAX/MIRALAX) powder   . predniSONE (DELTASONE) 5 MG tablet   . PREVIDENT 5000 BOOSTER PLUS 1.1 % PSTE   . primidone (MYSOLINE) 50 MG tablet   . PROAIR HFA 108 (90 BASE) MCG/ACT inhaler   . SPIRIVA HANDIHALER 18 MCG inhalation capsule   . TRILIPIX 135 MG capsule   . [  DISCONTINUED] amitriptyline (ELAVIL) 50 MG tablet   . [DISCONTINUED] CYMBALTA 30 MG capsule   . [DISCONTINUED] divalproex (DEPAKOTE ER) 500 MG 24 hr tablet Take 2 tablets (1,000 mg total) by mouth at bedtime.  Marland Kitchen escitalopram (LEXAPRO) 5 MG tablet Take 4 tablets (20 mg total) by mouth daily.      Physical Exam: Constitutional:  BP 128/81 mmHg  Pulse 100  Ht  (1.651 m)  Wt 153 lb (69.4 kg)  BMI 25.46  kg/m2  General Appearance: alert, oriented, no acute distress and well nourished  Musculoskeletal: Strength & Muscle Tone: within normal limits Gait & Station: normal Patient leans: N/A  Psychiatric: General Appearance: Casual and Well Groomed  Patent attorney::  Poor  Speech:  Slow  Volume:  Normal  Mood:  dysphoric  Affect:  Blunt  Thought Process:  Coherent, Linear and Logical  Orientation:  Full (Time, Place, and Person)  Thought Content:  WDL  Suicidal Thoughts:  No  Homicidal Thoughts:  No  Memory:  Immediate;   Good Recent;   Poor Remote;   Fair  Judgement:  Fair  Insight:  Negative and Fair  Psychomotor Activity:  Normal  Concentration:  Fair  Recall:  Poor  Akathisia:  Negative  Handed:  Right  AIMS (if indicated):     Assets:  Desire for Improvement Financial Resources/Insurance Housing Resilience Social Support Transportation      Assessment: Axis I: 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 GAD    Plan:   Plan of Care:  PLAN:  1. Affirm with the patient that the medications are taken as ordered. Patient  expressed understanding of how their medications were to be used.    Laboratory: Labs reviewed. No labs warranted at this time.   Psychotherapy: Therapy: brief supportive therapy provided.  Discussed psychosocial stressors in detail.    Medications:  Continue the following psychiatric medications as written prior to this appointment with the following changes::  a) depakote-ER 500 mg 2 tablets at bedtime. Add lexapro  qd.  Called pharmacy patient is not on cymbalta or Ellavil has not refiled for more then 2 months.  -Risks and benefits, side effects and alternatives discussed with patient, he was given an opportunity to ask questions about his medication, illness, and treatment. All current psychiatric medications have been reviewed and discussed with the patient and adjusted as clinically  appropriate. The patient has been provided an accurate and updated list of the medications being now prescribed.   Routine PRN Medications:  Negative  Consultations: The patient was encouraged to keep all PCP and specialty clinic appointments.   Safety Concerns:   Patient told to call clinic if any problems occur. Patient advised to go to  ER  if he should develop SI/HI, side effects, or if symptoms worsen. Has crisis numbers to call if needed.    Other:   8. Patient was instructed to return to clinic in 1 month.  9. The patient was advised to call and cancel their mental health appointment within 24 hours of the appointment, if they are unable to keep the appointment, as well as the three no show and termination from clinic policy. 10. The patient expressed understanding of the plan and agrees with the above.    Thresa Ross, M.D.  07/18/2014 10:36 AM

## 2014-08-30 ENCOUNTER — Ambulatory Visit (HOSPITAL_COMMUNITY): Payer: Medicare Other | Admitting: Psychiatry

## 2014-09-10 ENCOUNTER — Ambulatory Visit (INDEPENDENT_AMBULATORY_CARE_PROVIDER_SITE_OTHER): Payer: 59 | Admitting: Psychiatry

## 2014-09-10 ENCOUNTER — Encounter (HOSPITAL_COMMUNITY): Payer: Self-pay | Admitting: Psychiatry

## 2014-09-10 VITALS — BP 122/78 | HR 100 | Ht 65.0 in | Wt 152.0 lb

## 2014-09-10 DIAGNOSIS — F39 Unspecified mood [affective] disorder: Secondary | ICD-10-CM | POA: Diagnosis not present

## 2014-09-10 DIAGNOSIS — F331 Major depressive disorder, recurrent, moderate: Secondary | ICD-10-CM | POA: Diagnosis not present

## 2014-09-10 DIAGNOSIS — F411 Generalized anxiety disorder: Secondary | ICD-10-CM

## 2014-09-10 MED ORDER — ESCITALOPRAM OXALATE 10 MG PO TABS: 10.0000 mg | ORAL_TABLET | Freq: Every day | ORAL | Status: DC

## 2014-09-10 MED ORDER — DIVALPROEX SODIUM ER 500 MG PO TB24: 1000.0000 mg | ORAL_TABLET | Freq: Every day | ORAL | Status: DC

## 2014-09-10 NOTE — Progress Notes (Signed)
Patient ID: Micheal Estrada, male   DOB: 06/19/1950, 64 y.o.   MRN: [id number]   [facility] [id number] Progress Note  Micheal Estrada [id number] 64 y.o.  09/10/2014 10:56 AM  Chief Complaint:  HPI Comments: Mr. Trouten is  a 64 y/o male with a past psychiatric history significant for Major Depressive Disorder. 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 The patient is referred for psychiatric services for medication management.    Patient remains concerned about her daughter who is 64 years of age but does not come home for weeks. She has come back now and working at [employer]. Also concern about his son who is drinking. He does have a supportive wife. He is feeling down not hopeless but talked about withdrawn and depression. Recently his wife admitted for spinal surgery and still in hospital. That upsets him and he feels she is not doing too good.   endorses feeling of down and depression which is episodic and related to the worries off his family members.  He keeps himself busy at home but recent wife admission has upset him. He feels adding lexapro helped some  . Severity: Moderate to severe  . Duration: More than 5 years ago  . Timing: Throughout the day. More so during night  . Context: Health related issues.   . Modifying factors: None  . Associated signs and symptoms: No delusions or hallucinations  As noted in Psych: Review of systems.   History of Present Illness: Suicidal Ideation: No Plan Formed: No Patient has means to carry out plan: No  Homicidal Ideation: No Plan Formed: No Patient has means to carry out plan: No  Review of Systems: Psychiatric: Agitation: Negative Hallucination: Negative Depressed Mood: Negative Insomnia: Negative Hypersomnia: Negative Altered Concentration: Yes Feels Worthless: Negative Grandiose Ideas: Negative Belief In Special Powers: Negative New/Increased Substance Abuse: Negative Compulsions:  Negative  Neurologic: Headache: Negative Seizure: Negative Paresthesias: Negative  Past Medical Family, Social History:  Past Medical History  Diagnosis Date  . Diabetes mellitus, type II   . Hypertension       Outpatient Encounter Prescriptions as of 09/10/2014  Medication Sig  . ENBREL SURECLICK 50 MG/ML injection   . escitalopram (LEXAPRO) 10 MG tablet Take 1 tablet (10 mg total) by mouth daily.  . fenofibrate 160 MG tablet   . furosemide (LASIX) 20 MG tablet   . Hydrocodone-Acetaminophen 5-300 MG TABS   . hydrOXYzine (ATARAX/VISTARIL) 50 MG tablet Take 50 mg by mouth at bedtime as needed.   Marland Kitchen JANUVIA 100 MG tablet   . lisinopril (PRINIVIL,ZESTRIL) 5 MG tablet   . lovastatin (MEVACOR) 20 MG tablet   . metFORMIN (GLUCOPHAGE-XR) 500 MG 24 hr tablet   . methotrexate (RHEUMATREX) 2.5 MG tablet   . omeprazole (PRILOSEC) 40 MG capsule   . oxyCODONE-acetaminophen (PERCOCET) 10-325 MG per tablet   . polyethylene glycol powder (GLYCOLAX/MIRALAX) powder   . predniSONE (DELTASONE) 5 MG tablet   . PREVIDENT 5000 BOOSTER PLUS 1.1 % PSTE   . primidone (MYSOLINE) 50 MG tablet   . PROAIR HFA 108 (90 BASE) MCG/ACT inhaler   . SPIRIVA HANDIHALER 18 MCG inhalation capsule   . TRILIPIX 135 MG capsule   . [DISCONTINUED] escitalopram (LEXAPRO) 5 MG tablet Take 4 tablets (20 mg total) by mouth daily.  . CELEBREX 200 MG capsule   . cyclobenzaprine (FLEXERIL) 10 MG tablet   . divalproex (DEPAKOTE ER) 500 MG 24 hr tablet Take 2 tablets (1,000 mg total) by mouth at  bedtime.  . [DISCONTINUED] divalproex (DEPAKOTE ER) 500 MG 24 hr tablet Take 2 tablets (1,000 mg total) by mouth at bedtime.      Physical Exam: Constitutional:  BP 122/78 mmHg  Pulse 100  Ht 5\' 5"  (1.651 m)  Wt 152 lb (68.947 kg)  BMI 25.29 kg/m2  General Appearance: alert, oriented, no acute distress and well nourished  Musculoskeletal: Strength & Muscle Tone: within normal limits Gait & Station: normal Patient leans:  N/A  Psychiatric: General Appearance: Casual and Well Groomed  Patent attorneyye Contact::  Poor  Speech:  Slow  Volume:  Normal  Mood:  dysphoric  Affect:  Blunt  Thought Process:  Coherent, Linear and Logical  Orientation:  Full (Time, Place, and Person)  Thought Content:  WDL  Suicidal Thoughts:  No  Homicidal Thoughts:  No  Memory:  Immediate;   Good Recent;   Poor Remote;   Fair  Judgement:  Fair  Insight:  Negative and Fair  Psychomotor Activity:  Normal  Concentration:  Fair  Recall:  Poor  Akathisia:  Negative  Handed:  Right  AIMS (if indicated):     Assets:  Desire for Improvement Financial Resources/Insurance Housing Resilience Social Support Transportation      Assessment: Axis I: 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 GAD    Plan:   Plan of Care:  PLAN:  1. Affirm with the patient that the medications are taken as ordered. Patient  expressed understanding of how their medications were to be used.    Laboratory: Labs reviewed. No labs warranted at this time.   Psychotherapy: Therapy: brief supportive therapy provided.  Discussed psychosocial stressors in detail.    Medications:  Continue the following psychiatric medications as written prior to this appointment with the following changes::  a) depakote-ER 500 mg 2 tablets at bedtime. Increase lexapro to 10mg .   Labs done at primary care will connect to get results.  -Risks and benefits, side effects and alternatives discussed with patient, he was given an opportunity to ask questions about his medication, illness, and treatment. All current psychiatric medications have been reviewed and discussed with the patient and adjusted as clinically appropriate. The patient has been provided an accurate and updated list of the medications being now prescribed.   Routine PRN Medications:  Negative  Consultations: The patient was encouraged to keep all PCP and  specialty clinic appointments.   Safety Concerns:   Patient told to call clinic if any problems occur. Patient advised to go to  ER  if he should develop SI/HI, side effects, or if symptoms worsen. Has crisis numbers to call if needed.    Other:   8. Patient was instructed to return to clinic in 1 month.  9. The patient was advised to call and cancel their mental health appointment within 24 hours of the appointment, if they are unable to keep the appointment, as well as the three no show and termination from clinic policy. 50% time spent in counselling and education. 10. The patient expressed understanding of the plan and agrees with the above.   Time spent: 25 minutes  Thresa RossNADEEM Kayanna Mckillop, M.D.  09/10/2014 10:56 AM

## 2014-10-10 ENCOUNTER — Ambulatory Visit (HOSPITAL_COMMUNITY): Payer: Self-pay | Admitting: Psychiatry

## 2014-12-26 ENCOUNTER — Telehealth (HOSPITAL_COMMUNITY): Payer: Self-pay | Admitting: *Deleted

## 2014-12-26 MED ORDER — DIVALPROEX SODIUM ER 500 MG PO TB24: 1000.0000 mg | ORAL_TABLET | Freq: Every day | ORAL | Status: DC

## 2014-12-26 MED ORDER — ESCITALOPRAM OXALATE 10 MG PO TABS: 10.0000 mg | ORAL_TABLET | Freq: Every day | ORAL | Status: DC

## 2014-12-26 NOTE — Telephone Encounter (Signed)
PT called for a refill for Lexapro and Depakote. Per Dr. Gilmore Laroche, pt is authorized for a refill for Lexapro , Qty 7 and Depakote , Qty 14. Prescriptions were sent to [facility] pharmacy.  Pt has a f/u appt on 8/11. Pt verbalizes understanding.

## 2015-01-02 ENCOUNTER — Ambulatory Visit (INDEPENDENT_AMBULATORY_CARE_PROVIDER_SITE_OTHER): Payer: 59 | Admitting: Psychiatry

## 2015-01-02 ENCOUNTER — Encounter (HOSPITAL_COMMUNITY): Payer: Self-pay | Admitting: Psychiatry

## 2015-01-02 VITALS — BP 124/70 | HR 96 | Ht 65.0 in | Wt 150.0 lb

## 2015-01-02 DIAGNOSIS — F411 Generalized anxiety disorder: Secondary | ICD-10-CM

## 2015-01-02 DIAGNOSIS — F331 Major depressive disorder, recurrent, moderate: Secondary | ICD-10-CM | POA: Diagnosis not present

## 2015-01-02 DIAGNOSIS — F0630 Mood disorder due to known physiological condition, unspecified: Secondary | ICD-10-CM

## 2015-01-02 MED ORDER — DIVALPROEX SODIUM ER 500 MG PO TB24: 1000.0000 mg | ORAL_TABLET | Freq: Every day | ORAL | Status: DC

## 2015-01-02 NOTE — Progress Notes (Signed)
Patient ID: Micheal Estrada, male   DOB: May 29, 1950, 64 y.o.   MRN: [id number]   [facility] [address] Progress Note  Micheal Estrada [id number] 64 y.o.  01/02/2015 9:37 AM  Chief Complaint:  HPI Comments: Micheal Estrada is  a 64 y/o male with a past psychiatric history significant for Major Depressive Disorder. 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 The patient is referred for psychiatric services for medication management.    Patient remains concerned about her daughter who is 23 years of age but does not come home for weeks. They visited [place]. She still partied there. Wife also has neck pain. These things keep him down. He suffers from burn injuiries and pain which keep him frustrated at times.   Lexapro was added last visit but he had nausea so he could not tolerate says that he is only taking Depakote and that doesn't work.  . He does have a supportive wife. He is feeling down not hopeless but talked about withdrawn and depression. Recently his wife admitted for spinal surgery.   endorses feeling of down and depression which is episodic and related to the worries off his family members.  He keeps himself busy at home but recent wife admission has upset him. He feels adding lexapro helped some  . Severity: Moderate to severe  . Duration: More than 5 years ago  . Timing: Throughout the day. More so during night  . Context: Health related issues.   . Modifying factors: None  . Associated signs and symptoms: No delusions or hallucinations  As noted in Psych: Review of systems.   History of Present Illness: Suicidal Ideation: No Plan Formed: No Patient has means to carry out plan: No  Homicidal Ideation: No Plan Formed: No Patient has means to carry out plan: No  Review of Systems: Psychiatric: Agitation: Negative Hallucination: Negative Depressed Mood: Negative Insomnia: Negative Hypersomnia: Negative Altered Concentration:  Yes Feels Worthless: Negative Grandiose Ideas: Negative Belief In Special Powers: Negative New/Increased Substance Abuse: Negative Compulsions: Negative  Neurologic: Headache: Negative Seizure: Negative Paresthesias: Negative  Past Medical Family, Social History:  Past Medical History  Diagnosis Date  . Diabetes mellitus, type II   . Hypertension       Outpatient Encounter Prescriptions as of 01/02/2015  Medication Sig  . CELEBREX 200 MG capsule   . cyclobenzaprine (FLEXERIL) 10 MG tablet   . divalproex (DEPAKOTE ER) 500 MG 24 hr tablet Take 2 tablets (1,000 mg total) by mouth at bedtime.  Micheal Estrada SURECLICK 50 MG/ML injection   . fenofibrate 160 MG tablet   . furosemide (LASIX) 20 MG tablet   . Hydrocodone-Acetaminophen 5-300 MG TABS   . hydrOXYzine (ATARAX/VISTARIL) 50 MG tablet Take 50 mg by mouth at bedtime as needed.   Marland Kitchen JANUVIA 100 MG tablet   . lisinopril (PRINIVIL,ZESTRIL) 5 MG tablet   . lovastatin (MEVACOR) 20 MG tablet   . metFORMIN (GLUCOPHAGE-XR) 500 MG 24 hr tablet   . methotrexate (RHEUMATREX) 2.5 MG tablet   . omeprazole (PRILOSEC) 40 MG capsule   . oxyCODONE-acetaminophen (PERCOCET) 10-325 MG per tablet   . polyethylene glycol powder (GLYCOLAX/MIRALAX) powder   . predniSONE (DELTASONE) 5 MG tablet   . PREVIDENT 5000 BOOSTER PLUS 1.1 % PSTE   . primidone (MYSOLINE) 50 MG tablet   . PROAIR HFA 108 (90 BASE) MCG/ACT inhaler   . SPIRIVA HANDIHALER 18 MCG inhalation capsule   . TRILIPIX 135 MG capsule   . [DISCONTINUED] divalproex (DEPAKOTE ER)  500 MG 24 hr tablet Take 2 tablets (1,000 mg total) by mouth at bedtime.  . [DISCONTINUED] escitalopram (LEXAPRO) 10 MG tablet Take 1 tablet (10 mg total) by mouth daily.   No facility-administered encounter medications on file as of 01/02/2015.      Physical Exam: Constitutional:  BP 124/70 mmHg  Pulse 96  Ht  (1.651 m)  Wt 150 lb (68.04 kg)  BMI 24.96 kg/m2  SpO2 97%  General Appearance: alert,  oriented, no acute distress and well nourished  ROS: depressed, no nausea now . Denies chest pain.   Musculoskeletal: Strength & Muscle Tone: within normal limits Gait & Station: normal Patient leans: N/A  Psychiatric: General Appearance: Casual and Well Groomed  Patent attorney::  Poor  Speech:  Slow  Volume:  Normal  Mood:  dysphoric  Affect:  Blunt  Thought Process:  Coherent, Linear and Logical  Orientation:  Full (Time, Place, and Person)  Thought Content:  WDL  Suicidal Thoughts:  No  Homicidal Thoughts:  No  Memory:  Immediate;   Good Recent;   Poor Remote;   Fair  Judgement:  Fair  Insight:  Negative and Fair  Psychomotor Activity:  Normal  Concentration:  Fair  Recall:  Poor  Akathisia:  Negative  Handed:  Right  AIMS (if indicated):     Assets:  Desire for Improvement Financial Resources/Insurance Housing Resilience Social Support Transportation      Assessment: Axis I: 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 GAD    Plan:   Plan of Care:  PLAN:  1. Affirm with the patient that the medications are taken as ordered. Patient  expressed understanding of how their medications were to be used.    Laboratory: Labs reviewed. No labs warranted at this time.   Psychotherapy: Therapy: brief supportive therapy provided.  Discussed psychosocial stressors in detail.    Medications:  Continue the following psychiatric medications as written prior to this appointment with the following changes::  a) depakote-ER 500 mg 2 tablets at bedtime. Stop lexapro.  Says his pain effects his mood. He only wants to take depakote .   Labs done at primary care .  -Risks and benefits, side effects and alternatives discussed with patient, he was given an opportunity to ask questions about his medication, illness, and treatment. All current psychiatric medications have been reviewed and discussed with the patient and  adjusted as clinically appropriate. The patient has been provided an accurate and updated list of the medications being now prescribed.  50% time spent in counselling and coodination of care including review meds and supportive therapy.   Routine PRN Medications:  Negative  Consultations: The patient was encouraged to keep all PCP and specialty clinic appointments.   Safety Concerns:   Patient told to call clinic if any problems occur. Patient advised to go to  ER  if he should develop SI/HI, side effects, or if symptoms worsen. Has crisis numbers to call if needed.    Other:   8. Patient was instructed to return to clinic in 2 month.  9. The patient was advised to call and cancel their mental health appointment within 24 hours of the appointment, if they are unable to keep the appointment, as well as the three no show and termination from clinic policy. 50% time spent in counselling and education. 10. The patient expressed understanding of the plan and agrees with the above.   Time spent: 25 minutes  Fard Borunda  Gilmore Laroche, M.D.  01/02/2015 9:37 AM

## 2015-02-26 ENCOUNTER — Other Ambulatory Visit (HOSPITAL_COMMUNITY): Payer: Self-pay | Admitting: Psychiatry

## 2015-02-28 NOTE — Telephone Encounter (Signed)
Received request from [facility] for Depakote . Per Dr. Gilmore Laroche, pt medication is denied due to requesting medication too soon. Called and informed pt, medication request was too early. Pt verbalizes understanding. Pt has a f/u appt on 03/04/15.

## 2015-03-04 ENCOUNTER — Encounter (HOSPITAL_COMMUNITY): Payer: Self-pay | Admitting: Psychiatry

## 2015-03-04 ENCOUNTER — Ambulatory Visit (INDEPENDENT_AMBULATORY_CARE_PROVIDER_SITE_OTHER): Payer: 59 | Admitting: Psychiatry

## 2015-03-04 VITALS — BP 128/78 | HR 86 | Ht 65.0 in | Wt 154.0 lb

## 2015-03-04 DIAGNOSIS — F411 Generalized anxiety disorder: Secondary | ICD-10-CM | POA: Diagnosis not present

## 2015-03-04 DIAGNOSIS — F331 Major depressive disorder, recurrent, moderate: Secondary | ICD-10-CM

## 2015-03-04 DIAGNOSIS — F0630 Mood disorder due to known physiological condition, unspecified: Secondary | ICD-10-CM | POA: Diagnosis not present

## 2015-03-04 MED ORDER — DIVALPROEX SODIUM ER 500 MG PO TB24: 1000.0000 mg | ORAL_TABLET | Freq: Every day | ORAL | Status: DC

## 2015-03-04 MED ORDER — DIVALPROEX SODIUM 250 MG PO DR TAB: 250.0000 mg | DELAYED_RELEASE_TABLET | Freq: Every day | ORAL | Status: DC

## 2015-03-04 NOTE — Progress Notes (Signed)
Patient ID: Micheal Estrada, male   DOB: 1950/07/13, 64 y.o.   MRN: [id number]   [facility] [address] Progress Note  Micheal Estrada [id number] 64 y.o.  03/04/2015 10:53 AM  Chief Complaint:  HPI Comments: Micheal Estrada is  a 64 y/o male with a past psychiatric history significant for Major Depressive Disorder. 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 The patient is referred for psychiatric services for medication management.    Patient remains concerned about her daughter who is 21 years of age but does not come home for weeks. They visited [place]. She still partied there. Wife also has neck pain. These things keep him down. He suffers from burn injuiries and pain which keep him frustrated at times. Daughter is back home but does not talk to him much.   lexapro was DC last visit due to nausea.   . He does have a  Wife but she is sick and demanding .  Recently his wife admitted for spinal surgery.   endorses feeling of down and depression which is episodic and related to the worries off his family members.   . Severity: Moderate to severe  . Duration: More than 5 years ago  . Timing: Throughout the day. More so during night  . Context: Health related issues.   . Modifying factors: None  . Associated signs and symptoms: No delusions or hallucinations  As noted in Psych: Review of systems.   History of Present Illness: Suicidal Ideation: No Plan Formed: No Patient has means to carry out plan: No  Homicidal Ideation: No Plan Formed: No Patient has means to carry out plan: No  Review of Systems: Psychiatric: Agitation: Negative Hallucination: Negative Depressed Mood: Negative Insomnia: Negative Hypersomnia: Negative Altered Concentration: Yes Feels Worthless: Negative Grandiose Ideas: Negative Belief In Special Powers: Negative New/Increased Substance Abuse: Negative Compulsions: Negative  Neurologic: Headache:  Negative Seizure: Negative Paresthesias: Negative  Past Medical Family, Social History:  Past Medical History  Diagnosis Date  . Diabetes mellitus, type II (HCC)   . Hypertension       Outpatient Encounter Prescriptions as of 03/04/2015  Medication Sig  . CELEBREX 200 MG capsule   . cyclobenzaprine (FLEXERIL) 10 MG tablet   . divalproex (DEPAKOTE ER) 500 MG 24 hr tablet Take 2 tablets (1,000 mg total) by mouth at bedtime.  Elgie Collard SURECLICK 50 MG/ML injection   . fenofibrate 160 MG tablet   . furosemide (LASIX) 20 MG tablet   . Hydrocodone-Acetaminophen 5-300 MG TABS   . hydrOXYzine (ATARAX/VISTARIL) 50 MG tablet Take 50 mg by mouth at bedtime as needed.   Marland Kitchen JANUVIA 100 MG tablet   . lisinopril (PRINIVIL,ZESTRIL) 5 MG tablet   . lovastatin (MEVACOR) 20 MG tablet   . metFORMIN (GLUCOPHAGE-XR) 500 MG 24 hr tablet   . methotrexate (RHEUMATREX) 2.5 MG tablet   . omeprazole (PRILOSEC) 40 MG capsule   . oxyCODONE-acetaminophen (PERCOCET) 10-325 MG per tablet   . polyethylene glycol powder (GLYCOLAX/MIRALAX) powder   . predniSONE (DELTASONE) 5 MG tablet   . PREVIDENT 5000 BOOSTER PLUS 1.1 % PSTE   . primidone (MYSOLINE) 50 MG tablet   . PROAIR HFA 108 (90 BASE) MCG/ACT inhaler   . SPIRIVA HANDIHALER 18 MCG inhalation capsule   . TRILIPIX 135 MG capsule   . [DISCONTINUED] divalproex (DEPAKOTE ER) 500 MG 24 hr tablet Take 2 tablets (1,000 mg total) by mouth at bedtime.  . divalproex (DEPAKOTE) 250 MG DR tablet Take 1 tablet (250  mg total) by mouth daily. Generic OK. Adding  to the other dose of  taking at night   No facility-administered encounter medications on file as of 03/04/2015.      Physical Exam: Constitutional:  BP 128/78 mmHg  Pulse 86  Ht  (1.651 m)  Wt 154 lb (69.854 kg)  BMI 25.63 kg/m2  SpO2 98%  General Appearance: alert, oriented, no acute distress and well nourished  ROS: depressed, no nausea now . Denies chest pain.    Musculoskeletal: Strength & Muscle Tone: within normal limits Gait & Station: normal Patient leans: N/A  Psychiatric: General Appearance: Casual and Well Groomed  Patent attorney::  Poor  Speech:  Slow  Volume:  Normal  Mood:  Dysphoric but not hopeless  Affect:  Blunt  Thought Process:  Coherent, Linear and Logical  Orientation:  Full (Time, Place, and Person)  Thought Content:  WDL  Suicidal Thoughts:  No  Homicidal Thoughts:  No  Memory:  Immediate;   Good Recent;   Poor Remote;   Fair  Judgement:  Fair  Insight:  Negative and Fair  Psychomotor Activity:  Normal  Concentration:  Fair  Recall:  Poor  Akathisia:  Negative  Handed:  Right  AIMS (if indicated):     Assets:  Desire for Improvement Financial Resources/Insurance Housing Resilience Social Support Transportation      Assessment: Axis I: 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 GAD    Plan:   Plan of Care:  PLAN:  1. Affirm with the patient that the medications are taken as ordered. Patient  expressed understanding of how their medications were to be used.    Laboratory: Labs reviewed. No labs warranted at this time.   Psychotherapy: Therapy: brief supportive therapy provided.  Discussed psychosocial stressors in detail.    Medications:  Continue the following psychiatric medications as written prior to this appointment with the following changes::  a) depakote-ER 500 mg 2 tablets at bedtime. We will add Depakote  during the day for depression. Continue 1000 at night.   Says his pain effects his mood. He only wants to take depakote .   Labs done at primary care .  -Risks and benefits, side effects and alternatives discussed with patient, he was given an opportunity to ask questions about his medication, illness, and treatment. All current psychiatric medications have been reviewed and discussed with the patient and adjusted as  clinically appropriate. The patient has been provided an accurate and updated list of the medications being now prescribed.  50% time spent in counselling and coodination of care including review meds and supportive therapy.   Routine PRN Medications:  Negative  Consultations: The patient was encouraged to keep all PCP and specialty clinic appointments.   Safety Concerns:   Patient told to call clinic if any problems occur. Patient advised to go to  ER  if he should develop SI/HI, side effects, or if symptoms worsen. Has crisis numbers to call if needed.    Other:   8. Patient was instructed to return to clinic in 2 month.  9. The patient was advised to call and cancel their mental health appointment within 24 hours of the appointment, if they are unable to keep the appointment, as well as the three no show and termination from clinic policy. 50% time spent in counselling and education. 10. The patient expressed understanding of the plan and agrees with the above.   Time spent: 25 minutes  Thresa Ross, M.D.  03/04/2015 10:53 AM

## 2015-04-25 ENCOUNTER — Other Ambulatory Visit (HOSPITAL_COMMUNITY): Payer: Self-pay | Admitting: Psychiatry

## 2015-04-28 NOTE — Telephone Encounter (Signed)
Received medication request from [facility] for Depakote 500mg . Per Dr. Gilmore LarocheAkhtar, medication request is denied. Pt has request medication too early. Called and informed pt of prescription denial. Pt is schedule to see Dr. Gilmore LarocheAkhtar on 04/29/15. Pt verbalizes understanding.

## 2015-04-29 ENCOUNTER — Ambulatory Visit (INDEPENDENT_AMBULATORY_CARE_PROVIDER_SITE_OTHER): Payer: 59 | Admitting: Psychiatry

## 2015-04-29 DIAGNOSIS — F331 Major depressive disorder, recurrent, moderate: Secondary | ICD-10-CM | POA: Diagnosis not present

## 2015-04-29 DIAGNOSIS — F411 Generalized anxiety disorder: Secondary | ICD-10-CM

## 2015-04-29 DIAGNOSIS — F0630 Mood disorder due to known physiological condition, unspecified: Secondary | ICD-10-CM | POA: Diagnosis not present

## 2015-04-29 MED ORDER — DIVALPROEX SODIUM ER 500 MG PO TB24: 1000.0000 mg | ORAL_TABLET | Freq: Every day | ORAL | Status: DC

## 2015-04-29 NOTE — Progress Notes (Signed)
Patient ID: Micheal Estrada, male   DOB: 1950/08/25, 64 y.o.   MRN: [id number]   [facility] [address] Progress Note  Micheal Estrada [id number] 64 y.o.  04/29/2015 10:49 AM  Chief Complaint:  HPI Comments: Micheal Estrada is  a 64 y/o male with a past psychiatric history significant for Major Depressive Disorder. 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 The patient is referred for psychiatric services for medication management.    Patient remains concerned about her daughter who is 1 years of age but does not come home for weeks.  She still doesn't listen. Patient otherwise tolerating medication be added Depakote to 250 mg in addition but he was not able to tolerate that and he is okay with the 1000 g at night  Says wife was demanding but there is less argument and that has helped Recently his wife admitted for spinal surgery.   endorses feeling of down and  worries off his family members.  . Severity: Moderate to severe  . Duration: More than 5 years ago  . Timing: Throughout the day. More so during night  . Context: Health related issues.   . Modifying factors: None  . Associated signs and symptoms: No delusions or hallucinations  As noted in Psych: Review of systems.   History of Present Illness: Suicidal Ideation: No Plan Formed: No Patient has means to carry out plan: No  Homicidal Ideation: No Plan Formed: No Patient has means to carry out plan: No  Review of Systems: Psychiatric: Agitation: Negative Hallucination: Negative Depressed Mood: Negative Insomnia: Negative Hypersomnia: Negative Altered Concentration: Yes Feels Worthless: Negative Grandiose Ideas: Negative Belief In Special Powers: Negative New/Increased Substance Abuse: Negative Compulsions: Negative  Neurologic: Headache: Negative Seizure: Negative Paresthesias: Negative  Past Medical Family, Social History:  Past Medical History  Diagnosis Date  .  Diabetes mellitus, type II (HCC)   . Hypertension       Outpatient Encounter Prescriptions as of 04/29/2015  Medication Sig  . CELEBREX 200 MG capsule   . cyclobenzaprine (FLEXERIL) 10 MG tablet   . divalproex (DEPAKOTE ER) 500 MG 24 hr tablet Take 2 tablets (1,000 mg total) by mouth at bedtime.  Micheal Estrada SURECLICK 50 MG/ML injection   . fenofibrate 160 MG tablet   . furosemide (LASIX) 20 MG tablet   . Hydrocodone-Acetaminophen 5-300 MG TABS   . hydrOXYzine (ATARAX/VISTARIL) 50 MG tablet Take 50 mg by mouth at bedtime as needed.   Micheal Estrada Kitchen JANUVIA 100 MG tablet   . lisinopril (PRINIVIL,ZESTRIL) 5 MG tablet   . lovastatin (MEVACOR) 20 MG tablet   . metFORMIN (GLUCOPHAGE-XR) 500 MG 24 hr tablet   . methotrexate (RHEUMATREX) 2.5 MG tablet   . omeprazole (PRILOSEC) 40 MG capsule   . oxyCODONE-acetaminophen (PERCOCET) 10-325 MG per tablet   . polyethylene glycol powder (GLYCOLAX/MIRALAX) powder   . predniSONE (DELTASONE) 5 MG tablet   . PREVIDENT 5000 BOOSTER PLUS 1.1 % PSTE   . primidone (MYSOLINE) 50 MG tablet   . PROAIR HFA 108 (90 BASE) MCG/ACT inhaler   . SPIRIVA HANDIHALER 18 MCG inhalation capsule   . TRILIPIX 135 MG capsule   . [DISCONTINUED] divalproex (DEPAKOTE ER) 500 MG 24 hr tablet Take 2 tablets (1,000 mg total) by mouth at bedtime.  . [DISCONTINUED] divalproex (DEPAKOTE) 250 MG DR tablet Take 1 tablet (250 mg total) by mouth daily. Generic OK. Adding  to the other dose of  taking at night   No facility-administered encounter medications on file  as of 04/29/2015.      Physical Exam: Constitutional:  There were no vitals taken for this visit.  General Appearance: alert, oriented, no acute distress and well nourished  ROS: depressed, no nausea now . Denies chest pain. Or palpitations  Musculoskeletal: Strength & Muscle Tone: within normal limits Gait & Station: normal Patient leans: N/A  Psychiatric: General Appearance: Casual and Well Groomed  Proofreaderye  Contact::  Poor  Speech:  Slow  Volume:  Normal  Mood:  Dysphoric but not hopeless  Affect:  Blunt  Thought Process:  Coherent, Linear and Logical  Orientation:  Full (Time, Place, and Person)  Thought Content:  WDL  Suicidal Thoughts:  No  Homicidal Thoughts:  No  Memory:  Immediate;   Good Recent;   Poor Remote;   Fair  Judgement:  Fair  Insight:  Negative and Fair  Psychomotor Activity:  Normal  Concentration:  Fair  Recall:  Poor  Akathisia:  Negative  Handed:  Right  AIMS (if indicated):     Assets:  Desire for Improvement Financial Resources/Insurance Housing Resilience Social Support Transportation      Assessment: Axis I: 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 GAD    Plan:   Plan of Care:  PLAN:  1. Affirm with the patient that the medications are taken as ordered. Patient  expressed understanding of how their medications were to be used.    Laboratory: Labs reviewed. No labs warranted at this time.   Psychotherapy: Therapy: brief supportive therapy provided.  Discussed psychosocial stressors in detail.    Medications:  Continue the following psychiatric medications as written prior to this appointment with the following changes::  a) depakote-ER 500 mg 2 tablets at bedtime. Stop depakote of 250mg  at night  Says his pain effects his mood. He only wants to take depakote . Less argument at home has helped. No side effects  Labs done at primary care .  -Risks and benefits, side effects and alternatives discussed with patient, he was given an opportunity to ask questions about his medication, illness, and treatment. All current psychiatric medications have been reviewed and discussed with the patient and adjusted as clinically appropriate. The patient has been provided an accurate and updated list of the medications being now prescribed.  50% time spent in counselling and coodination of care  including review meds and supportive therapy.   Routine PRN Medications:  Negative  Consultations: The patient was encouraged to keep all PCP and specialty clinic appointments.   Safety Concerns:   Patient told to call clinic if any problems occur. Patient advised to go to  ER  if he should develop SI/HI, side effects, or if symptoms worsen. Has crisis numbers to call if needed.    Other:   8. Patient was instructed to return to clinic in 2 month.  9. The patient was advised to call and cancel their mental health appointment within 24 hours of the appointment, if they are unable to keep the appointment, as well as the three no show and termination from clinic policy. 50% time spent in counselling and education. 10. The patient expressed understanding of the plan and agrees with the above.   Time spent: 25 minutes  Thresa RossNADEEM Enrique Manganaro, M.D.  04/29/2015 10:49 AM

## 2015-05-05 ENCOUNTER — Ambulatory Visit (HOSPITAL_COMMUNITY): Payer: 59 | Admitting: Psychiatry

## 2015-06-28 ENCOUNTER — Other Ambulatory Visit (HOSPITAL_COMMUNITY): Payer: Self-pay | Admitting: Psychiatry

## 2015-06-30 NOTE — Telephone Encounter (Signed)
Received medication request from [facility] for Depakote. Per Dr. Gilmore Laroche, pt is authorized for a refill Depakote , #60. Prescription was sent to pharmacy. Pt is schedule for a f/u appt 07/03/15. Called and informed pt of prescription status. Pt verbalizes understanding.

## 2015-07-03 ENCOUNTER — Encounter (HOSPITAL_COMMUNITY): Payer: Self-pay | Admitting: Psychiatry

## 2015-07-03 ENCOUNTER — Ambulatory Visit (INDEPENDENT_AMBULATORY_CARE_PROVIDER_SITE_OTHER): Payer: 59 | Admitting: Psychiatry

## 2015-07-03 VITALS — BP 130/74 | HR 91 | Ht 65.0 in | Wt 150.0 lb

## 2015-07-03 DIAGNOSIS — F0630 Mood disorder due to known physiological condition, unspecified: Secondary | ICD-10-CM

## 2015-07-03 DIAGNOSIS — F331 Major depressive disorder, recurrent, moderate: Secondary | ICD-10-CM

## 2015-07-03 DIAGNOSIS — F4329 Adjustment disorder with other symptoms: Secondary | ICD-10-CM | POA: Diagnosis not present

## 2015-07-03 DIAGNOSIS — F411 Generalized anxiety disorder: Secondary | ICD-10-CM | POA: Diagnosis not present

## 2015-07-03 MED ORDER — DIVALPROEX SODIUM ER 500 MG PO TB24: 1000.0000 mg | ORAL_TABLET | Freq: Every day | ORAL | Status: DC

## 2015-07-03 NOTE — Progress Notes (Signed)
Patient ID: Micheal Estrada, male   DOB: 1951/04/21, 65 y.o.   MRN: [id number]   [facility] [address] Progress Note  Micheal Estrada [id number] 65 y.o.  07/03/2015 11:05 AM  Chief Complaint:  HPI Comments: Micheal Estrada is  a 65 y/o male with a past psychiatric history significant for Major Depressive Disorder. 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 The patient is referred for psychiatric services for medication management.    Last visit we cut down Depakote to 1000 g at night and avoided that during the daytime. He is feeling more alert. He feels comfortable with the medication. Says blood work has been done by primary care physician he will get the results. Wife is less argumentative and is feeling better medically. He remains still concerned about his daughter who does not come back for days and it stress him out.  Worries are there but not excessive  . Severity: Moderate to severe  . Duration: More than 5 years ago  . Timing: Throughout the day. More so during night  . Context: Health related issues.   . Modifying factors: None  . Associated signs and symptoms: No delusions or hallucinations No psychotic symptoms As noted in Psych: Review of systems.   History of Present Illness: Suicidal Ideation: No Plan Formed: No Patient has means to carry out plan: No  Homicidal Ideation: No Plan Formed: No Patient has means to carry out plan: No  Review of Systems: Psychiatric: Agitation: Negative Hallucination: Negative Depressed Mood: Negative Insomnia: Negative Hypersomnia: Negative Altered Concentration: Yes Feels Worthless: Negative Grandiose Ideas: Negative Belief In Special Powers: Negative New/Increased Substance Abuse: Negative Compulsions: Negative  Neurologic: Headache: Negative Seizure: Negative Paresthesias: Negative  Past Medical Family, Social History:  Past Medical History  Diagnosis Date  . Diabetes mellitus,  type II (HCC)   . Hypertension       Outpatient Encounter Prescriptions as of 07/03/2015  Medication Sig  . CELEBREX 200 MG capsule   . cyclobenzaprine (FLEXERIL) 10 MG tablet   . divalproex (DEPAKOTE ER) 500 MG 24 hr tablet Take 2 tablets (1,000 mg total) by mouth at bedtime.  Micheal Estrada 50 MG/ML injection   . fenofibrate 160 MG tablet   . furosemide (LASIX) 20 MG tablet   . Hydrocodone-Acetaminophen 5-300 MG TABS   . hydrOXYzine (ATARAX/VISTARIL) 50 MG tablet Take 50 mg by mouth at bedtime as needed.   Micheal Estrada JANUVIA 100 MG tablet   . lisinopril (PRINIVIL,ZESTRIL) 5 MG tablet   . lovastatin (MEVACOR) 20 MG tablet   . metFORMIN (GLUCOPHAGE-XR) 500 MG 24 hr tablet   . methotrexate (RHEUMATREX) 2.5 MG tablet   . omeprazole (PRILOSEC) 40 MG capsule   . oxyCODONE-acetaminophen (PERCOCET) 10-325 MG per tablet   . polyethylene glycol powder (GLYCOLAX/MIRALAX) powder   . predniSONE (DELTASONE) 5 MG tablet   . PREVIDENT 5000 BOOSTER PLUS 1.1 % PSTE   . primidone (MYSOLINE) 50 MG tablet   . PROAIR HFA 108 (90 BASE) MCG/ACT inhaler   . SPIRIVA HANDIHALER 18 MCG inhalation capsule   . TRILIPIX 135 MG capsule   . [DISCONTINUED] divalproex (DEPAKOTE ER) 500 MG 24 hr tablet TAKE 2 TABLETS BY MOUTH AT BEDTIME   No facility-administered encounter medications on file as of 07/03/2015.      Physical Exam: Constitutional:  BP 130/74 mmHg  Pulse 91  Ht  (1.651 m)  Wt 150 lb (68.04 kg)  BMI 24.96 kg/m2  SpO2 92%  General Appearance: alert, oriented,  no acute distress and well nourished  ROS: depressed, no nausea now . Denies chest pain. Or palpitations  Musculoskeletal: Strength & Muscle Tone: within normal limits Gait & Station: normal Patient leans: N/A  Psychiatric: General Appearance: Casual and Well Groomed  Patent attorney::  Poor  Speech:  Slow  Volume:  Normal  Mood:  Less dysphoric  Affect:  Blunt  Thought Process:  Coherent, Linear and Logical  Orientation:  Full  (Time, Place, and Person)  Thought Content:  WDL  Suicidal Thoughts:  No  Homicidal Thoughts:  No  Memory:  Immediate;   Good Recent;   Poor Remote;   Fair  Judgement:  Fair  Insight:  Negative and Fair  Psychomotor Activity:  Normal  Concentration:  Fair  Recall:  Poor  Akathisia:  Negative  Handed:  Right  AIMS (if indicated):     Assets:  Desire for Improvement Financial Resources/Insurance Housing Resilience Social Support Transportation      Assessment: Axis I: 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 GAD    Plan:   Plan of Care:  PLAN:  1. Affirm with the patient that the medications are taken as ordered. Patient  expressed understanding of how their medications were to be used.    Laboratory:  Labs as per primary care. Have requested depakote level.  Psychotherapy:  More then 50% of time spent in counselling. Therapy: brief supportive therapy provided.  Discussed psychosocial stressors in detail.    Medications:  Continue the following psychiatric medications as written prior to this appointment with the following changes::  a) depakote-ER 500 mg 2 tablets at bedtime.   Says his pain effects his mood. He only wants to take depakote . Less argument at home has helped. No side effects  Labs done at primary care .    Routine PRN Medications:  Negative  Consultations: The patient was encouraged to keep all PCP and specialty clinic appointments.   Safety Concerns:   Patient told to call clinic if any problems occur. Patient advised to go to  ER  if he should develop SI/HI, side effects, or if symptoms worsen. Has crisis numbers to call if needed.    Other:   8. Patient was instructed to return to clinic in 2 month.  9. The patient was advised to call and cancel their mental health appointment within 24 hours of the appointment, if they are unable to keep the appointment, as well as the three no show  and termination from clinic policy. 50% time spent in counselling and education. 10. The patient expressed understanding of the plan and agrees with the above.   Time spent: 25 minutes  Micheal Estrada, M.D.  07/03/2015 11:05 AM

## 2015-09-26 ENCOUNTER — Encounter (HOSPITAL_COMMUNITY): Payer: Self-pay | Admitting: Psychiatry

## 2015-09-26 ENCOUNTER — Ambulatory Visit (INDEPENDENT_AMBULATORY_CARE_PROVIDER_SITE_OTHER): Payer: 59 | Admitting: Psychiatry

## 2015-09-26 VITALS — BP 124/70 | HR 74 | Ht 65.0 in | Wt 152.0 lb

## 2015-09-26 DIAGNOSIS — F4329 Adjustment disorder with other symptoms: Secondary | ICD-10-CM | POA: Diagnosis not present

## 2015-09-26 DIAGNOSIS — F411 Generalized anxiety disorder: Secondary | ICD-10-CM

## 2015-09-26 DIAGNOSIS — F331 Major depressive disorder, recurrent, moderate: Secondary | ICD-10-CM

## 2015-09-26 DIAGNOSIS — F0630 Mood disorder due to known physiological condition, unspecified: Secondary | ICD-10-CM | POA: Diagnosis not present

## 2015-09-26 MED ORDER — DIVALPROEX SODIUM ER 500 MG PO TB24: 1000.0000 mg | ORAL_TABLET | Freq: Every day | ORAL | Status: DC

## 2015-09-26 MED ORDER — ESCITALOPRAM OXALATE 5 MG PO TABS: 5.0000 mg | ORAL_TABLET | Freq: Every day | ORAL | Status: DC

## 2015-09-26 NOTE — Progress Notes (Signed)
Patient ID: Micheal Estrada Quizhpi, male   DOB: Feb 26, 1951, 65 y.o.   MRN: [id number]   [facility] [id number] Progress Note  Micheal Estrada Lipton [id number] 65 y.o.  09/26/2015 10:42 AM  Chief Complaint:  HPI Comments: [name] is  a 65 y/o male with a past psychiatric history significant for Major Depressive Disorder. 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 The patient returns  for psychiatric services and  medication management.     Mood was feels somewhat down. He was found positive for marijuana so the pain clinic and stopped giving him his pain medication says that he is now using marijuana more regularly because there is no choice. He aches and pain walks without steak has concern about his back condition and pain which aggravates his mood and sleep.  Other worries: wife sick , daughter comes and goes doesn't listin.  . Severity: Moderate to severe  . Duration: More than 7 years ago  . Timing: Throughout the day. More so during night  . Context: Health related issues.   . Modifying factors: None  . Associated signs and symptoms: No delusions or hallucinations No psychotic symptoms As noted in Psych: Review of systems.   History of Present Illness: Suicidal Ideation: No Plan Formed: No Patient has means to carry out plan: No  Homicidal Ideation: No Plan Formed: No Patient has means to carry out plan: No  Review of Systems: Psychiatric: Agitation: Negative Hallucination: Negative Depressed Mood: yes   Neurologic: Headache: Negative Seizure: Negative Paresthesias: Negative  Past Medical Family, Social History:  Past Medical History  Diagnosis Date  . Diabetes mellitus, type II (HCC)   . Hypertension       Outpatient Encounter Prescriptions as of 09/26/2015  Medication Sig  . CELEBREX 200 MG capsule   . cyclobenzaprine (FLEXERIL) 10 MG tablet   . divalproex (DEPAKOTE ER) 500 MG 24 hr tablet Take 2 tablets (1,000 mg total) by  mouth at bedtime.  Elgie Collard. ENBREL SURECLICK 50 MG/ML injection   . fenofibrate 160 MG tablet   . furosemide (LASIX) 20 MG tablet   . hydrOXYzine (ATARAX/VISTARIL) 50 MG tablet Take 50 mg by mouth at bedtime as needed.   Marland Kitchen. JANUVIA 100 MG tablet   . lisinopril (PRINIVIL,ZESTRIL) 5 MG tablet   . lovastatin (MEVACOR) 20 MG tablet   . metFORMIN (GLUCOPHAGE-XR) 500 MG 24 hr tablet   . methotrexate (RHEUMATREX) 2.5 MG tablet   . omeprazole (PRILOSEC) 40 MG capsule Reported on 09/26/2015  . polyethylene glycol powder (GLYCOLAX/MIRALAX) powder   . predniSONE (DELTASONE) 5 MG tablet   . PREVIDENT 5000 BOOSTER PLUS 1.1 % PSTE   . primidone (MYSOLINE) 50 MG tablet   . PROAIR HFA 108 (90 BASE) MCG/ACT inhaler   . SPIRIVA HANDIHALER 18 MCG inhalation capsule   . TRILIPIX 135 MG capsule   . [DISCONTINUED] divalproex (DEPAKOTE ER) 500 MG 24 hr tablet Take 2 tablets (1,000 mg total) by mouth at bedtime.  Marland Kitchen. escitalopram (LEXAPRO) 5 MG tablet Take 1 tablet (5 mg total) by mouth daily.  . Hydrocodone-Acetaminophen 5-300 MG TABS Reported on 09/26/2015  . oxyCODONE-acetaminophen (PERCOCET) 10-325 MG per tablet Reported on 09/26/2015   No facility-administered encounter medications on file as of 09/26/2015.      Physical Exam: Constitutional:  BP 124/70 mmHg  Pulse 74  Ht 5\' 5"  (1.651 m)  Wt 152 lb (68.947 kg)  BMI 25.29 kg/m2  SpO2 97%  General Appearance: alert oriented but has pain and difficulty  walking without stick ROS: depressed, no nausea now . Denies chest pain. Or palpitations  Musculoskeletal: Strength & Muscle Tone: within normal limits Gait & Station: normal Patient leans: N/A  Psychiatric: General Appearance: Casual and Well Groomed  Patent attorney::  Poor  Speech:  Slow  Volume:  Normal  Mood:   dysphoric  Affect:  Blunt  Thought Process:  Coherent, Linear and Logical  Orientation:  Full (Time, Place, and Person)  Thought Content:  WDL  Suicidal Thoughts:  No  Homicidal Thoughts:  No   Memory:  Immediate;   Good Recent;   Poor Remote;   Fair  Judgement:  Fair  Insight:  Negative and Fair  Psychomotor Activity:  Normal  Concentration:  Fair  Recall:  Poor  Akathisia:  Negative  Handed:  Right  AIMS (if indicated):     Assets:  Desire for Improvement Financial Resources/Insurance Housing Resilience Social Support Transportation      Assessment: Axis I: 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 GAD    Plan:   Plan of Care:  PLAN:   Affirm with the patient that the medications are taken as ordered. Patient  expressed understanding of how their medications were to be used.    Laboratory:  Labs as per primary care. Have requested depakote level.  Psychotherapy:  More then 50% of time spent in counselling. Therapy: brief supportive therapy provided.  Discussed psychosocial stressors in detail.    Medications:  Continue the following psychiatric medications as written prior to this appointment with the following changes::  a) continue depakote-ER 500 mg 2 tablets at bedtime. Labs done by primary care. GAD: add small dose of lexapro  qd. Reviewed side effects.  Stop marijauana and consider to get back with pain clinic.  Sleep poor at times. Reviewed sleep hygiene.  Says his pain effects his mood.   Labs done at primary care .    Routine PRN Medications:  Negative  Consultations: The patient was encouraged to keep all PCP and specialty clinic appointments.        Time spent: 25 minutes  Thresa Ross, M.D.  09/26/2015 10:42 AM

## 2015-11-18 ENCOUNTER — Telehealth (HOSPITAL_COMMUNITY): Payer: Self-pay | Admitting: Psychiatry

## 2015-11-18 NOTE — Telephone Encounter (Signed)
patient declined to rschd appt at this time. informed patient there will not be any more refills given until another appointment. patient states he will be fine. patient made an appt for 01/16/16.

## 2015-11-19 ENCOUNTER — Ambulatory Visit (HOSPITAL_COMMUNITY): Payer: Self-pay | Admitting: Psychiatry

## 2015-11-29 ENCOUNTER — Other Ambulatory Visit (HOSPITAL_COMMUNITY): Payer: Self-pay | Admitting: Psychiatry

## 2015-12-03 ENCOUNTER — Telehealth (HOSPITAL_COMMUNITY): Payer: Self-pay | Admitting: Psychiatry

## 2015-12-08 NOTE — Telephone Encounter (Signed)
Received fax from [facility] requesting refills for Depakote and Lexapro. Per Dr.Akhtar, refills were authorize for Depakote 500mg , #60 and Lexapro 5mg , #30. Rx were sent to [facility]. Called and informed pt of refill status. Pt f/u appt is schedule for 01/16/16. Pt verbalizes understanding.

## 2015-12-08 NOTE — Telephone Encounter (Signed)
Refills sent to pharmacy on 12/08/15.

## 2016-01-02 ENCOUNTER — Other Ambulatory Visit (HOSPITAL_COMMUNITY): Payer: Self-pay | Admitting: Psychiatry

## 2016-01-05 NOTE — Telephone Encounter (Signed)
Received fax from [facility] requesting refills for Lexapro and Depakote.  Per Dr. Gilmore LarocheAkhtar, refill request are authorized for Lexapro 5mg , #30 and Depakote 500mg , #60. Refills were escribed to pharmacy. Pt is schedule for a f/u appt on 01/16/16. Called and informed pt of refill status. Pt shows understanding.

## 2016-01-16 ENCOUNTER — Ambulatory Visit (INDEPENDENT_AMBULATORY_CARE_PROVIDER_SITE_OTHER): Payer: 59 | Admitting: Psychiatry

## 2016-01-16 DIAGNOSIS — F0630 Mood disorder due to known physiological condition, unspecified: Secondary | ICD-10-CM | POA: Diagnosis not present

## 2016-01-16 DIAGNOSIS — F411 Generalized anxiety disorder: Secondary | ICD-10-CM | POA: Diagnosis not present

## 2016-01-16 DIAGNOSIS — F4329 Adjustment disorder with other symptoms: Secondary | ICD-10-CM

## 2016-01-16 MED ORDER — ESCITALOPRAM OXALATE 5 MG PO TABS: ORAL_TABLET | ORAL | 2 refills | Status: DC

## 2016-01-16 MED ORDER — DIVALPROEX SODIUM ER 500 MG PO TB24: ORAL_TABLET | ORAL | 2 refills | Status: DC

## 2016-01-16 NOTE — Progress Notes (Signed)
Patient ID: Micheal Estrada, male   DOB: 1951-04-29, 65 y.o.   MRN: [id number]   [facility] [address] Progress Note  Micheal Estrada [id number] 65 y.o.  01/16/2016 12:42 PM  Chief Complaint:  HPI Comments: Mr. Micheal Estrada is  a 65 y/o male with a past psychiatric history significant for Major Depressive Disorder. 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 The patient returns  for psychiatric services and  medication management.    Mood was feels somewhat down. He was found positive for marijuana so not getting pain meds from pain clinic. Uses marijuana infrequently for pain now Trying to move more so body doesn't feel stiff.  Other worries: wife sick , daughter comes and goes doesn't listin.  . Severity: Moderate to severe Tolerating depakote  and lexapro was started last visit it  helps his mood and depression .  Marland Kitchen Duration: More than 7 years ago  . Timing: Throughout the day. More so during night  . Context: Health related issues.   . Modifying factors: None  . Associated signs and symptoms: No delusions or hallucinations No psychotic symptoms As noted in Psych: Review of systems.   History of Present Illness: Suicidal Ideation: No Plan Formed: No Patient has means to carry out plan: No  Homicidal Ideation: No Plan Formed: No Patient has means to carry out plan: No  Review of Systems: Psychiatric: Agitation: Negative Hallucination: Negative Depressed Mood: yes   Neurologic: Headache: Negative Seizure: Negative Paresthesias: Negative  Past Medical Family, Social History:  Past Medical History:  Diagnosis Date  . Diabetes mellitus, type II (HCC)   . Hypertension       Outpatient Encounter Prescriptions as of 01/16/2016  Medication Sig  . CELEBREX 200 MG capsule   . cyclobenzaprine (FLEXERIL) 10 MG tablet   . divalproex (DEPAKOTE ER) 500 MG 24 hr tablet TAKE 2 TABLETS (1,000 MG TOTAL) BY MOUTH AT BEDTIME.  Marland Kitchen ENBREL  SURECLICK 50 MG/ML injection   . escitalopram (LEXAPRO) 5 MG tablet TAKE 1 TABLET (5 MG TOTAL) BY MOUTH DAILY.  . fenofibrate 160 MG tablet   . furosemide (LASIX) 20 MG tablet   . Hydrocodone-Acetaminophen 5-300 MG TABS Reported on 09/26/2015  . hydrOXYzine (ATARAX/VISTARIL) 50 MG tablet Take 50 mg by mouth at bedtime as needed.   Marland Kitchen JANUVIA 100 MG tablet   . lisinopril (PRINIVIL,ZESTRIL) 5 MG tablet   . lovastatin (MEVACOR) 20 MG tablet   . metFORMIN (GLUCOPHAGE-XR) 500 MG 24 hr tablet   . methotrexate (RHEUMATREX) 2.5 MG tablet   . omeprazole (PRILOSEC) 40 MG capsule Reported on 09/26/2015  . oxyCODONE-acetaminophen (PERCOCET) 10-325 MG per tablet Reported on 09/26/2015  . polyethylene glycol powder (GLYCOLAX/MIRALAX) powder   . predniSONE (DELTASONE) 5 MG tablet   . PREVIDENT 5000 BOOSTER PLUS 1.1 % PSTE   . primidone (MYSOLINE) 50 MG tablet   . PROAIR HFA 108 (90 BASE) MCG/ACT inhaler   . SPIRIVA HANDIHALER 18 MCG inhalation capsule   . TRILIPIX 135 MG capsule   . [DISCONTINUED] divalproex (DEPAKOTE ER) 500 MG 24 hr tablet TAKE 2 TABLETS (1,000 MG TOTAL) BY MOUTH AT BEDTIME.  . [DISCONTINUED] escitalopram (LEXAPRO) 5 MG tablet TAKE 1 TABLET (5 MG TOTAL) BY MOUTH DAILY.   No facility-administered encounter medications on file as of 01/16/2016.       Physical Exam: Constitutional:  There were no vitals taken for this visit.  General Appearance: alert oriented but has pain and difficulty walking without stick ROS: depressed, no  nausea now . Denies chest pain. Or palpitations  Musculoskeletal: Strength & Muscle Tone: within normal limits Gait & Station: normal Patient leans: N/A  Psychiatric: General Appearance: Casual and Well Groomed  Patent attorneyye Contact::  Poor  Speech:  Slow  Volume:  Normal  Mood:   Somewhat dysphoric  Affect:  Blunt  Thought Process:  Coherent, Linear and Logical  Orientation:  Full (Time, Place, and Person)  Thought Content:  WDL  Suicidal Thoughts:  No   Homicidal Thoughts:  No  Memory:  Immediate;   Good Recent;   Poor Remote;   Fair  Judgement:  Fair  Insight:  Negative and Fair  Psychomotor Activity:  Normal  Concentration:  Fair  Recall:  Poor  Akathisia:  Negative  Handed:  Right  AIMS (if indicated):     Assets:  Desire for Improvement Financial Resources/Insurance Housing Resilience Social Support Transportation      Assessment: Axis I: 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 GAD    Plan:   Plan of Care:  PLAN:   Affirm with the patient that the medications are taken as ordered. Patient  expressed understanding of how their medications were to be used.    Laboratory:  Labs as per primary care. Have requested depakote level.  Psychotherapy:  More then 50% of time spent in counselling. Therapy: brief supportive therapy provided.  Discussed psychosocial stressors in detail.    Medications:  Continue the following psychiatric medications as written prior to this appointment with the following changes::  a) continue depakote-ER 500 mg 2 tablets at bedtime. Labs done by primary care. GAD: continue  lexapro 5mg  qd. Reviewed side effects.  Stop marijauana and consider to get back with pain clinic.  Sleep poor at times. Reviewed sleep hygiene.  Says his pain effects his mood.   Labs done at primary care .    Routine PRN Medications:  Negative  Consultations: The patient was encouraged to keep all PCP and specialty clinic appointments.        Time spent: 25 minutes  Thresa RossNADEEM Meeka Cartelli, M.D.  01/16/2016 12:42 PM

## 2016-04-12 ENCOUNTER — Encounter (HOSPITAL_COMMUNITY): Payer: Self-pay | Admitting: Psychiatry

## 2016-04-12 ENCOUNTER — Ambulatory Visit (INDEPENDENT_AMBULATORY_CARE_PROVIDER_SITE_OTHER): Payer: 59 | Admitting: Psychiatry

## 2016-04-12 DIAGNOSIS — F4329 Adjustment disorder with other symptoms: Secondary | ICD-10-CM | POA: Diagnosis not present

## 2016-04-12 DIAGNOSIS — F411 Generalized anxiety disorder: Secondary | ICD-10-CM | POA: Diagnosis not present

## 2016-04-12 DIAGNOSIS — F0630 Mood disorder due to known physiological condition, unspecified: Secondary | ICD-10-CM | POA: Diagnosis not present

## 2016-04-12 DIAGNOSIS — Z79899 Other long term (current) drug therapy: Secondary | ICD-10-CM

## 2016-04-12 MED ORDER — DIVALPROEX SODIUM ER 500 MG PO TB24: ORAL_TABLET | ORAL | 2 refills | Status: DC

## 2016-04-12 MED ORDER — ESCITALOPRAM OXALATE 5 MG PO TABS: ORAL_TABLET | ORAL | 2 refills | Status: DC

## 2016-04-12 NOTE — Progress Notes (Signed)
Patient ID: Micheal Estrada, male   DOB: 1950/07/29, 65 y.o.   MRN: [id number]   [facility] [id number] Progress Note  Micheal Estrada [id number] 65 y.o.  04/12/2016 1:30 PM  Chief Complaint:  HPI Comments: Micheal Estrada is  a 65 y/o male with a past psychiatric history significant for Major Depressive Disorder. 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 The patient returns  for psychiatric services and  medication management.     Patient is taking his Depakote and Lexapro denies having any side effects depression wise is doing better not using is marijuana so he is also back on pain medication that has helped bring his pain and a letter to his anxiety and depression  Aggravating factors is his wife's sickness and also his daughter's condition Duration of mood symptoms have been more than 8 years  Modifying factors; medications and time to himself when he gets    History of Present Illness: Suicidal Ideation: No Plan Formed: No Patient has means to carry out plan: No  Homicidal Ideation: No Plan Formed: No Patient has means to carry out plan: No  Review of Systems: Psychiatric: Agitation: Negative Hallucination: Negative Depressed Mood: improved   Neurologic: Headache: Negative Seizure: Negative Paresthesias: Negative  Past Medical Family, Social History:  Past Medical History:  Diagnosis Date  . Diabetes mellitus, type II (HCC)   . Hypertension       Outpatient Encounter Prescriptions as of 04/12/2016  Medication Sig  . CELEBREX 200 MG capsule   . cyclobenzaprine (FLEXERIL) 10 MG tablet   . divalproex (DEPAKOTE ER) 500 MG 24 hr tablet TAKE 2 TABLETS (1,000 MG TOTAL) BY MOUTH AT BEDTIME.  Marland Kitchen. ENBREL SURECLICK 50 MG/ML injection   . escitalopram (LEXAPRO) 5 MG tablet TAKE 1 TABLET (5 MG TOTAL) BY MOUTH DAILY.  . fenofibrate 160 MG tablet   . furosemide (LASIX) 20 MG tablet   . Hydrocodone-Acetaminophen 5-300 MG TABS Reported on  09/26/2015  . hydrOXYzine (ATARAX/VISTARIL) 50 MG tablet Take 50 mg by mouth at bedtime as needed.   Marland Kitchen. JANUVIA 100 MG tablet   . lisinopril (PRINIVIL,ZESTRIL) 5 MG tablet   . lovastatin (MEVACOR) 20 MG tablet   . metFORMIN (GLUCOPHAGE-XR) 500 MG 24 hr tablet   . methotrexate (RHEUMATREX) 2.5 MG tablet   . omeprazole (PRILOSEC) 40 MG capsule Reported on 09/26/2015  . oxyCODONE-acetaminophen (PERCOCET) 10-325 MG per tablet Reported on 09/26/2015  . polyethylene glycol powder (GLYCOLAX/MIRALAX) powder   . predniSONE (DELTASONE) 5 MG tablet   . PREVIDENT 5000 BOOSTER PLUS 1.1 % PSTE   . primidone (MYSOLINE) 50 MG tablet   . PROAIR HFA 108 (90 BASE) MCG/ACT inhaler   . SPIRIVA HANDIHALER 18 MCG inhalation capsule   . TRILIPIX 135 MG capsule   . [DISCONTINUED] divalproex (DEPAKOTE ER) 500 MG 24 hr tablet TAKE 2 TABLETS (1,000 MG TOTAL) BY MOUTH AT BEDTIME.  . [DISCONTINUED] escitalopram (LEXAPRO) 5 MG tablet TAKE 1 TABLET (5 MG TOTAL) BY MOUTH DAILY.   No facility-administered encounter medications on file as of 04/12/2016.       Physical Exam: Constitutional:  There were no vitals taken for this visit.  General Appearance: alert oriented but has pain and difficulty walking without stick ROS: no nausea, headache, endorses generalized pain and aches.   Musculoskeletal: Strength & Muscle Tone: within normal limits Gait & Station: normal Patient leans: N/A  Psychiatric: General Appearance: Casual and Well Groomed  Eye Contact::  Poor  Speech:  Slow  Volume:  Normal  Mood:   Less dysphoric  Affect:  Blunt  Thought Process:  Coherent, Linear and Logical  Orientation:  Full (Time, Place, and Person)  Thought Content:  WDL  Suicidal Thoughts:  No  Homicidal Thoughts:  No  Memory:  Immediate;   Good Recent;   Poor Remote;   Fair  Judgement:  Fair  Insight:  Negative and Fair  Psychomotor Activity:  Normal  Concentration:  Fair  Recall:  Poor  Akathisia:  Negative  Handed:  Right   AIMS (if indicated):     Assets:  Desire for Improvement Financial Resources/Insurance Housing Resilience Social Support Transportation      Assessment: Axis I: 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 GAD    Plan:   Plan of Care:  PLAN:   Affirm with the patient that the medications are taken as ordered. Patient  expressed understanding of how their medications were to be used.    Laboratory:  Labs as per primary care. Have requested depakote level.  Psychotherapy:  More than 50% of 25 minutes spent in counseling and coordination of care including patient education and review side effects and also was psychotherapy and to deal with the stress related with his wife's sickness   Medications:  Continue the following psychiatric medications as written prior to this appointment with the following changes::  Mood disorderl; continue depakote  DepressionL: continue lexapro Anxiety continue lexapro Prescriptions sent.    Labs done at primary care .    Routine PRN Medications:  Negative  Consultations: The patient was encouraged to keep all PCP and specialty clinic appointments.          Thresa RossNADEEM Jimmie Rueter, M.D.  04/12/2016 1:30 PM

## 2016-07-06 ENCOUNTER — Encounter (HOSPITAL_COMMUNITY): Payer: Self-pay | Admitting: Psychiatry

## 2016-07-06 ENCOUNTER — Ambulatory Visit (INDEPENDENT_AMBULATORY_CARE_PROVIDER_SITE_OTHER): Payer: 59 | Admitting: Psychiatry

## 2016-07-06 VITALS — BP 136/84 | HR 88 | Resp 18 | Ht 65.0 in | Wt 148.0 lb

## 2016-07-06 DIAGNOSIS — Z79899 Other long term (current) drug therapy: Secondary | ICD-10-CM | POA: Diagnosis not present

## 2016-07-06 DIAGNOSIS — F411 Generalized anxiety disorder: Secondary | ICD-10-CM | POA: Diagnosis not present

## 2016-07-06 DIAGNOSIS — F0630 Mood disorder due to known physiological condition, unspecified: Secondary | ICD-10-CM

## 2016-07-06 DIAGNOSIS — F4329 Adjustment disorder with other symptoms: Secondary | ICD-10-CM

## 2016-07-06 DIAGNOSIS — Z79891 Long term (current) use of opiate analgesic: Secondary | ICD-10-CM

## 2016-07-06 MED ORDER — ESCITALOPRAM OXALATE 10 MG PO TABS: ORAL_TABLET | ORAL | 2 refills | Status: DC

## 2016-07-06 MED ORDER — DIVALPROEX SODIUM ER 500 MG PO TB24: ORAL_TABLET | ORAL | 2 refills | Status: DC

## 2016-07-06 NOTE — Progress Notes (Signed)
Patient ID: Micheal Estrada, male   DOB: 07-Mar-1951, 66 y.o.   MRN: [id number]   [facility] [address] Progress Note  Teryn Gust [id number] 66 y.o.  07/06/2016 1:44 PM  Chief Complaint:  HPI Comments: Mr. Micheal Estrada returns for follow-up and medication management for PTSD depression and anxiety   In regarding the bipolar his Depakote level was done  by primary care physician report is not available but says that it was within normal limits he does sleep reasonable with Depakote he still worries about his daughter  lexapro helps depression. But apparently is feeling somewhat more down and anxious it is related with his wife's sickness and daughter's condition Depression is worse.  Aggravating factors is his wife's sickness and also his daughter's condition Duration of mood symptoms have been more than 8 years  Modifying factors; medications and time to himself when he gets    Review of Systems: No nausea. Depression worse.   Past Medical Family, Social History:  Past Medical History:  Diagnosis Date  . Diabetes mellitus, type II (HCC)   . Hypertension       Outpatient Encounter Prescriptions as of 07/06/2016  Medication Sig  . CELEBREX 200 MG capsule   . cyclobenzaprine (FLEXERIL) 10 MG tablet   . divalproex (DEPAKOTE ER) 500 MG 24 hr tablet TAKE 2 TABLETS (1,000 MG TOTAL) BY MOUTH AT BEDTIME.  Marland Kitchen ENBREL SURECLICK 50 MG/ML injection   . escitalopram (LEXAPRO) 10 MG tablet TAKE 1 TABLET (5 MG TOTAL) BY MOUTH DAILY.  . fenofibrate 160 MG tablet   . furosemide (LASIX) 20 MG tablet   . hydrOXYzine (ATARAX/VISTARIL) 50 MG tablet Take 50 mg by mouth at bedtime as needed.   Marland Kitchen JANUVIA 100 MG tablet   . lisinopril (PRINIVIL,ZESTRIL) 5 MG tablet   . lovastatin (MEVACOR) 20 MG tablet   . metFORMIN (GLUCOPHAGE-XR) 500 MG 24 hr tablet   . methotrexate (RHEUMATREX) 2.5 MG tablet   . omeprazole (PRILOSEC) 40 MG capsule Reported on 09/26/2015  .  oxyCODONE-acetaminophen (PERCOCET) 10-325 MG per tablet Reported on 09/26/2015  . polyethylene glycol powder (GLYCOLAX/MIRALAX) powder   . predniSONE (DELTASONE) 5 MG tablet   . PREVIDENT 5000 BOOSTER PLUS 1.1 % PSTE   . primidone (MYSOLINE) 50 MG tablet   . PROAIR HFA 108 (90 BASE) MCG/ACT inhaler   . SPIRIVA HANDIHALER 18 MCG inhalation capsule   . TRILIPIX 135 MG capsule   . [DISCONTINUED] divalproex (DEPAKOTE ER) 500 MG 24 hr tablet TAKE 2 TABLETS (1,000 MG TOTAL) BY MOUTH AT BEDTIME.  . [DISCONTINUED] escitalopram (LEXAPRO) 5 MG tablet TAKE 1 TABLET (5 MG TOTAL) BY MOUTH DAILY.  . [DISCONTINUED] Hydrocodone-Acetaminophen 5-300 MG TABS Reported on 09/26/2015   No facility-administered encounter medications on file as of 07/06/2016.       Physical Exam: Constitutional:  BP 136/84 (BP Location: Right Arm, Patient Position: Sitting, Cuff Size: Normal)   Pulse 88   Resp 18   Ht 5\' 5"  (1.651 m)   Wt 148 lb (67.1 kg)   SpO2 99%   BMI 24.63 kg/m   General Appearance: alert oriented but has pain and difficulty walking without stick ROS: no nausea, headache, endorses generalized pain and aches.   Musculoskeletal: Strength & Muscle Tone: within normal limits Gait & Station: normal Patient leans: N/A  Psychiatric: General Appearance: Casual and Well Groomed  Patent attorney::  Poor  Speech:  Slow  Volume:  Normal  Mood:   dysphoric  Affect:  Blunt  Thought Process:  Coherent, Linear and Logical  Orientation:  Full (Time, Place, and Person)  Thought Content:  WDL  Suicidal Thoughts:  No  Homicidal Thoughts:  No  Memory:  Immediate;   Good Recent;   Poor Remote;   Fair  Judgement:  Fair  Insight:  Negative and Fair  Psychomotor Activity:  Normal  Concentration:  Fair  Recall:  Poor  Akathisia:  Negative  Handed:  Right  AIMS (if indicated):     Assets:  Desire for Improvement Financial Resources/Insurance Housing Resilience Social Support Transportation       Assessment: Axis I: 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 GAD    Plan:   Plan of Care:  PLAN:   Affirm with the patient that the medications are taken as ordered. Patient  expressed understanding of how their medications were to be used.    Laboratory:  Labs as per primary care. Have requested depakote level.  Psychotherapy:  More than 50% of 25 minutes spent in counseling and coordination of care including patient education and review side effects and also was psychotherapy and to deal with the stress related with his wife's sickness   Medications:  Continue the following psychiatric medications as written prior to this appointment with the following changes::  Mood disorder: depression somewhat worse will increase lexapro 10mg .  Continue depakote.  DepressionL: see above Anxiety fluctuates. See increase dose to lexapro.  Prescriptions sent.    Labs done at primary care .    Routine PRN Medications:  Negative  Consultations: The patient was encouraged to keep all PCP and specialty clinic appointments.       FU 1-2 months. He wants to come back in 3 months and will call early if needed.   Thresa RossNADEEM Minoru Chap, M.D.  07/06/2016 1:44 PM

## 2016-07-31 ENCOUNTER — Other Ambulatory Visit (HOSPITAL_COMMUNITY): Payer: Self-pay | Admitting: Psychiatry

## 2016-08-05 NOTE — Telephone Encounter (Signed)
Received fax from [facility] requesting refill for Lexapro. Per Dr. Gilmore LarocheAkhtar, refill request is denied. Lexapro Rx was sent to pharmacy on 07/06/16 w/ 2 additional refills. Pt's next apt is schedule on 10/20/16. Lvm informed pt of refill status.  Nothing further is needed at this time.

## 2016-09-28 ENCOUNTER — Other Ambulatory Visit (HOSPITAL_COMMUNITY): Payer: Self-pay | Admitting: Psychiatry

## 2016-09-29 ENCOUNTER — Ambulatory Visit (INDEPENDENT_AMBULATORY_CARE_PROVIDER_SITE_OTHER): Payer: 59 | Admitting: Psychiatry

## 2016-09-29 ENCOUNTER — Encounter (HOSPITAL_COMMUNITY): Payer: Self-pay | Admitting: Psychiatry

## 2016-09-29 VITALS — BP 112/80 | HR 90 | Ht 65.0 in | Wt 147.0 lb

## 2016-09-29 DIAGNOSIS — Z79899 Other long term (current) drug therapy: Secondary | ICD-10-CM

## 2016-09-29 DIAGNOSIS — Z79891 Long term (current) use of opiate analgesic: Secondary | ICD-10-CM | POA: Diagnosis not present

## 2016-09-29 DIAGNOSIS — F4329 Adjustment disorder with other symptoms: Secondary | ICD-10-CM

## 2016-09-29 DIAGNOSIS — F411 Generalized anxiety disorder: Secondary | ICD-10-CM

## 2016-09-29 DIAGNOSIS — F0630 Mood disorder due to known physiological condition, unspecified: Secondary | ICD-10-CM | POA: Diagnosis not present

## 2016-09-29 MED ORDER — ESCITALOPRAM OXALATE 10 MG PO TABS: ORAL_TABLET | ORAL | 2 refills | Status: DC

## 2016-09-29 MED ORDER — DIVALPROEX SODIUM ER 500 MG PO TB24: ORAL_TABLET | ORAL | 2 refills | Status: DC

## 2016-09-29 NOTE — Progress Notes (Signed)
Patient ID: Micheal Estrada, male   DOB: Oct 28, 1950, 66 y.o.   MRN: [id number]   [facility] [address] Progress Note  Micheal Estrada [id number] 66 y.o.  09/29/2016 1:17 PM  Chief Complaint:  HPI Comments: Mr. Beach returns for follow-up and medication management for PTSD depression and anxiety   Patient suffers from pain that affects his mobility and depression otherwise he is tolerating medication feels they're helping. Worries about wife and daughter Depression not worse Anxiety fluctuates:   Modifying factors; me time or when he is pain free     Review of Systems: No nausea. Depression worse.   Past Medical Family, Social History:  Past Medical History:  Diagnosis Date  . Diabetes mellitus, type II (HCC)   . Hypertension       Outpatient Encounter Prescriptions as of 09/29/2016  Medication Sig  . CELEBREX 200 MG capsule   . cyclobenzaprine (FLEXERIL) 10 MG tablet   . divalproex (DEPAKOTE ER) 500 MG 24 hr tablet TAKE 2 TABLETS (1,000 MG TOTAL) BY MOUTH AT BEDTIME.  Marland Kitchen ENBREL SURECLICK 50 MG/ML injection   . escitalopram (LEXAPRO) 10 MG tablet TAKE 1 TABLET (5 MG TOTAL) BY MOUTH DAILY.  . fenofibrate 160 MG tablet   . furosemide (LASIX) 20 MG tablet   . hydrOXYzine (ATARAX/VISTARIL) 50 MG tablet Take 50 mg by mouth at bedtime as needed.   Marland Kitchen JANUVIA 100 MG tablet   . lisinopril (PRINIVIL,ZESTRIL) 5 MG tablet   . lovastatin (MEVACOR) 20 MG tablet   . metFORMIN (GLUCOPHAGE-XR) 500 MG 24 hr tablet   . methotrexate (RHEUMATREX) 2.5 MG tablet   . omeprazole (PRILOSEC) 40 MG capsule Reported on 09/26/2015  . oxyCODONE-acetaminophen (PERCOCET) 10-325 MG per tablet Reported on 09/26/2015  . polyethylene glycol powder (GLYCOLAX/MIRALAX) powder   . predniSONE (DELTASONE) 5 MG tablet   . PREVIDENT 5000 BOOSTER PLUS 1.1 % PSTE   . primidone (MYSOLINE) 50 MG tablet   . PROAIR HFA 108 (90 BASE) MCG/ACT inhaler   . SPIRIVA HANDIHALER 18 MCG inhalation  capsule   . TRILIPIX 135 MG capsule   . [DISCONTINUED] divalproex (DEPAKOTE ER) 500 MG 24 hr tablet TAKE 2 TABLETS (1,000 MG TOTAL) BY MOUTH AT BEDTIME.  . [DISCONTINUED] escitalopram (LEXAPRO) 10 MG tablet TAKE 1 TABLET (5 MG TOTAL) BY MOUTH DAILY.   No facility-administered encounter medications on file as of 09/29/2016.       Physical Exam: Constitutional:  BP 112/80   Pulse 90   Ht 5\' 5"  (1.651 m)   Wt 147 lb (66.7 kg)   BMI 24.46 kg/m   General Appearance: alert oriented. Difficult to walk. No nausea or chest pain  Psychiatric: General Appearance: Casual and Well Groomed  Eye Contact::  Poor  Speech:  Slow  Volume:  Normal  Mood:   Somewhat sad  Affect:  Fair   Thought Process:  Coherent, Linear and Logical  Orientation:  Full (Time, Place, and Person)  Thought Content:  WDL  Suicidal Thoughts:  No  Homicidal Thoughts:  No  Memory:  Immediate;   Good Recent;   Poor Remote;   Fair  Judgement:  Fair  Insight:  Negative and Fair  Psychomotor Activity:  Normal  Concentration:  Fair  Recall:  Poor  Akathisia:  Negative  Handed:  Right  AIMS (if indicated):     Assets:  Desire for Improvement Financial Resources/Insurance Housing Resilience Social Support Transportation      Assessment: Axis I: Maj. depressive disorder recurrent  moderate. 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 GAD    Plan:   Plan of Care:  PLAN:   Affirm with the patient that the medications are taken as ordered. Patient  expressed understanding of how their medications were to be used.    Laboratory:  Labs as per primary care. Have requested depakote level.  Psychotherapy:  More than 50% of 25 minutes spent in counseling and coordination of care including patient education and review side effects and also was psychotherapy and to deal with the stress related with his wife's sickness   Medications:  Continue the following psychiatric medications as written prior to this  appointment with the following changes::  Mood disorder: baseline depressed or sad not worse. Continue depakote. Levels done by primary care. Reports verbal was normal Depression: not worse Anxiety : worries about daughter. Continue lexapro      Routine PRN Medications:  Negative  Consultations: The patient was encouraged to keep all PCP and specialty clinic appointments.       FU 3 months   Thresa RossNADEEM Amneet Cendejas, M.D.  09/29/2016 1:17 PM

## 2016-12-26 ENCOUNTER — Other Ambulatory Visit (HOSPITAL_COMMUNITY): Payer: Self-pay | Admitting: Psychiatry

## 2016-12-30 ENCOUNTER — Encounter (HOSPITAL_COMMUNITY): Payer: Self-pay | Admitting: Psychiatry

## 2016-12-30 ENCOUNTER — Ambulatory Visit (INDEPENDENT_AMBULATORY_CARE_PROVIDER_SITE_OTHER): Payer: Medicare Other | Admitting: Psychiatry

## 2016-12-30 DIAGNOSIS — F411 Generalized anxiety disorder: Secondary | ICD-10-CM

## 2016-12-30 DIAGNOSIS — F4310 Post-traumatic stress disorder, unspecified: Secondary | ICD-10-CM | POA: Diagnosis not present

## 2016-12-30 DIAGNOSIS — F4329 Adjustment disorder with other symptoms: Secondary | ICD-10-CM

## 2016-12-30 DIAGNOSIS — F331 Major depressive disorder, recurrent, moderate: Secondary | ICD-10-CM | POA: Diagnosis not present

## 2016-12-30 DIAGNOSIS — F0630 Mood disorder due to known physiological condition, unspecified: Secondary | ICD-10-CM

## 2016-12-30 MED ORDER — DIVALPROEX SODIUM ER 500 MG PO TB24: ORAL_TABLET | ORAL | 2 refills | Status: DC

## 2016-12-30 MED ORDER — ESCITALOPRAM OXALATE 10 MG PO TABS: ORAL_TABLET | ORAL | 2 refills | Status: DC

## 2016-12-30 NOTE — Progress Notes (Signed)
Patient ID: Micheal Estrada, male   DOB: 1951/05/08, 66 y.o.   MRN: [id number]   [facility] [address] Progress Note  Micheal Estrada [id number] 66 y.o.  12/30/2016 2:10 PM  Chief Complaint:  HPI Comments: [name] returns for follow-up and medication management for PTSD depression and anxiety  Patient has multiple medical conditions but most concerning his pain that affects his mood and limitations. His wife is also psych his daughter does not keep connected with them. There is things bother him otherwise he tries to keep himself busy but does not have much to do and has limitations. He is tolerating medications mood wise is feeling somewhat more down and anxious  Modifying factors is; lives with his wife       Review of Systems: No nausea. Depression worse.   Past Medical Family, Social History:  Past Medical History:  Diagnosis Date  . Diabetes mellitus, type II (HCC)   . Hypertension       Outpatient Encounter Prescriptions as of 12/30/2016  Medication Sig  . CELEBREX 200 MG capsule   . cyclobenzaprine (FLEXERIL) 10 MG tablet   . divalproex (DEPAKOTE ER) 500 MG 24 hr tablet TAKE 2 TABLETS (1,000 MG TOTAL) BY MOUTH AT BEDTIME.  Marland Kitchen ENBREL SURECLICK 50 MG/ML injection   . escitalopram (LEXAPRO) 10 MG tablet Take one and one half total of 15mg  .  . fenofibrate 160 MG tablet   . furosemide (LASIX) 20 MG tablet   . hydrOXYzine (ATARAX/VISTARIL) 50 MG tablet Take 50 mg by mouth at bedtime as needed.   Marland Kitchen JANUVIA 100 MG tablet   . lisinopril (PRINIVIL,ZESTRIL) 5 MG tablet   . lovastatin (MEVACOR) 20 MG tablet   . metFORMIN (GLUCOPHAGE-XR) 500 MG 24 hr tablet   . methotrexate (RHEUMATREX) 2.5 MG tablet   . omeprazole (PRILOSEC) 40 MG capsule Reported on 09/26/2015  . oxyCODONE-acetaminophen (PERCOCET) 10-325 MG per tablet Reported on 09/26/2015  . polyethylene glycol powder (GLYCOLAX/MIRALAX) powder   . predniSONE (DELTASONE) 5 MG tablet   . PREVIDENT  5000 BOOSTER PLUS 1.1 % PSTE   . primidone (MYSOLINE) 50 MG tablet   . PROAIR HFA 108 (90 BASE) MCG/ACT inhaler   . SPIRIVA HANDIHALER 18 MCG inhalation capsule   . TRILIPIX 135 MG capsule   . [DISCONTINUED] divalproex (DEPAKOTE ER) 500 MG 24 hr tablet TAKE 2 TABLETS (1,000 MG TOTAL) BY MOUTH AT BEDTIME.  . [DISCONTINUED] escitalopram (LEXAPRO) 10 MG tablet TAKE 1 TABLET (5 MG TOTAL) BY MOUTH DAILY.   No facility-administered encounter medications on file as of 12/30/2016.       Physical Exam: Constitutional:  There were no vitals taken for this visit.  General Appearance: alert oriented. Difficult to walk. No nausea or chest pain  Psychiatric: General Appearance: Casual and Well Groomed  Eye Contact::  Poor  Speech:  Slow  Volume:  Normal  Mood:  sad  Affect:  congruent  Thought Process:  Coherent, Linear and Logical  Orientation:  Full (Time, Place, and Person)  Thought Content:  WDL  Suicidal Thoughts:  No  Homicidal Thoughts:  No  Memory:  Immediate;   Good Recent;   Poor Remote;   Fair  Judgement:  Fair  Insight:  Negative and Fair  Psychomotor Activity:  Normal  Concentration:  Fair  Recall:  Poor  Akathisia:  Negative  Handed:  Right  AIMS (if indicated):     Assets:  Desire for Improvement Financial Resources/Insurance Housing Resilience Social Support Transportation  Assessment: Axis I: 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 GAD    Plan:         Medications:  Continue the following psychiatric medications as written prior to this appointment with the following changes::  Mood disorder:depressed. Continue depakote. Will increase lexapro  Depression: somewhat worse. Increase lexapro to 15mg  Anxiety : more worries related to daughter. Increase lexapro. Provided supportive therapy. Will follow up earlier this time     Routine PRN Medications:  Negative  Consultations: The patient  was encouraged to keep all PCP and specialty clinic appointments.       FU 1-2 months    Thresa RossNADEEM Dominiqua Cooner, M.D.  12/30/2016 2:10 PM

## 2017-02-22 ENCOUNTER — Ambulatory Visit (HOSPITAL_COMMUNITY): Payer: Self-pay | Admitting: Psychiatry

## 2017-03-28 ENCOUNTER — Other Ambulatory Visit (HOSPITAL_COMMUNITY): Payer: Self-pay | Admitting: Psychiatry

## 2017-04-06 ENCOUNTER — Encounter (HOSPITAL_COMMUNITY): Payer: Self-pay | Admitting: Psychiatry

## 2017-04-06 ENCOUNTER — Other Ambulatory Visit: Payer: Self-pay

## 2017-04-06 ENCOUNTER — Ambulatory Visit (INDEPENDENT_AMBULATORY_CARE_PROVIDER_SITE_OTHER): Payer: Medicare Other | Admitting: Psychiatry

## 2017-04-06 VITALS — BP 126/74 | HR 106 | Resp 18 | Ht 65.0 in | Wt 149.0 lb

## 2017-04-06 DIAGNOSIS — F411 Generalized anxiety disorder: Secondary | ICD-10-CM | POA: Diagnosis not present

## 2017-04-06 DIAGNOSIS — F0630 Mood disorder due to known physiological condition, unspecified: Secondary | ICD-10-CM | POA: Diagnosis not present

## 2017-04-06 DIAGNOSIS — F4329 Adjustment disorder with other symptoms: Secondary | ICD-10-CM | POA: Diagnosis not present

## 2017-04-06 MED ORDER — ESCITALOPRAM OXALATE 10 MG PO TABS: ORAL_TABLET | ORAL | 2 refills | Status: DC

## 2017-04-06 MED ORDER — DIVALPROEX SODIUM ER 500 MG PO TB24: ORAL_TABLET | ORAL | 2 refills | Status: DC

## 2017-04-06 NOTE — Progress Notes (Signed)
Patient ID: Micheal Estrada, male   DOB: 01/01/1951, 66 y.o.   MRN: [id number]   [facility] [id number] Progress Note  Micheal Estrada [id number] 66 y.o.  04/06/2017 10:21 AM  Chief Complaint:  HPI Comments: [name] returns for follow-up and medication management for PTSD depression and anxiety  Patient has multiple medical conditions but most concerning his pain that affects his mood and limitations. Wife is sick and in pain He suffers from pain more during winter. Limited walk Modifying factors : wife Daughter has her episodes of keeping distance  Sleep fair  Anxiety flucutates  No side effects reported      Review of Systems: No nausea. Depression worse.   Past Medical Family, Social History:  Past Medical History:  Diagnosis Date  . Diabetes mellitus, type II (HCC)   . Hypertension       Outpatient Encounter Medications as of 04/06/2017  Medication Sig  . CELEBREX 200 MG capsule   . cyclobenzaprine (FLEXERIL) 10 MG tablet   . divalproex (DEPAKOTE ER) 500 MG 24 hr tablet TAKE 2 TABLETS (1,000 MG TOTAL) BY MOUTH AT BEDTIME.  Marland Kitchen. ENBREL SURECLICK 50 MG/ML injection   . escitalopram (LEXAPRO) 10 MG tablet Take one and one half total of 15mg  .  . fenofibrate 160 MG tablet   . furosemide (LASIX) 20 MG tablet   . hydrOXYzine (ATARAX/VISTARIL) 50 MG tablet Take 50 mg by mouth at bedtime as needed.   Marland Kitchen. JANUVIA 100 MG tablet   . lisinopril (PRINIVIL,ZESTRIL) 5 MG tablet   . lovastatin (MEVACOR) 20 MG tablet   . metFORMIN (GLUCOPHAGE-XR) 500 MG 24 hr tablet   . methotrexate (RHEUMATREX) 2.5 MG tablet   . omeprazole (PRILOSEC) 40 MG capsule Reported on 09/26/2015  . oxyCODONE-acetaminophen (PERCOCET) 10-325 MG per tablet Reported on 09/26/2015  . polyethylene glycol powder (GLYCOLAX/MIRALAX) powder   . predniSONE (DELTASONE) 5 MG tablet   . PREVIDENT 5000 BOOSTER PLUS 1.1 % PSTE   . primidone (MYSOLINE) 50 MG tablet   . PROAIR HFA 108 (90 BASE)  MCG/ACT inhaler   . SPIRIVA HANDIHALER 18 MCG inhalation capsule   . TRILIPIX 135 MG capsule   . [DISCONTINUED] divalproex (DEPAKOTE ER) 500 MG 24 hr tablet TAKE 2 TABLETS (1,000 MG TOTAL) BY MOUTH AT BEDTIME.  . [DISCONTINUED] escitalopram (LEXAPRO) 10 MG tablet Take one and one half total of 15mg  .   No facility-administered encounter medications on file as of 04/06/2017.       Physical Exam: Constitutional:  BP 126/74 (BP Location: Right Arm, Patient Position: Sitting, Cuff Size: Normal)   Pulse (!) 106   Resp 18   Ht 5\' 5"  (1.651 m)   Wt 149 lb (67.6 kg)   SpO2 98%   BMI 24.79 kg/m   General Appearance: alert oriented. Difficult to walk. No nausea or chest pain  Psychiatric: General Appearance: Casual and Well Groomed  Eye Contact::  Poor  Speech:  Slow  Volume:  Normal  Mood: subdued  Affect:  Congruent cooperative  Thought Process:  Coherent, Linear and Logical  Orientation:  Full (Time, Place, and Person)  Thought Content:  WDL  Suicidal Thoughts:  No  Homicidal Thoughts:  No  Memory:  Immediate;   Good Recent;   Poor Remote;   Fair  Judgement:  Fair  Insight:  Negative and Fair  Psychomotor Activity:  Normal  Concentration:  Fair  Recall:  Poor  Akathisia:  Negative  Handed:  Right  AIMS (if  indicated):     Assets:  Desire for Improvement Financial Resources/Insurance Housing Resilience Social Support Transportation      Assessment: Axis I: 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 GAD    Plan:         Medications:  Continue the following psychiatric medications as written prior to this appointment with the following changes::  Mood disorder:depressed. Subdued. Not worse. Will request for depakote level as well. Renew depakote and lexapro  Depression: subdued. Continue meds Anxiety : fluctuates. Continue lexapro.  Provided supportive therapy. Questions addressed Levels for  depakote requested FU 3 months     Routine PRN Medications:  Negative  Consultations: The patient was encouraged to keep all PCP and specialty clinic appointments.          Thresa RossNADEEM Kiffany Schelling, M.D.  04/06/2017 10:21 AM

## 2017-04-07 LAB — VALPROIC ACID LEVEL: Valproic Acid Lvl: 67.6 mg/L (ref 50.0–100.0)

## 2017-06-29 ENCOUNTER — Ambulatory Visit (INDEPENDENT_AMBULATORY_CARE_PROVIDER_SITE_OTHER): Payer: Medicare Other | Admitting: Psychiatry

## 2017-06-29 ENCOUNTER — Encounter (HOSPITAL_COMMUNITY): Payer: Self-pay | Admitting: Psychiatry

## 2017-06-29 ENCOUNTER — Other Ambulatory Visit: Payer: Self-pay

## 2017-06-29 VITALS — BP 112/82 | HR 112 | Ht 65.0 in | Wt 148.0 lb

## 2017-06-29 DIAGNOSIS — F0630 Mood disorder due to known physiological condition, unspecified: Secondary | ICD-10-CM

## 2017-06-29 DIAGNOSIS — F411 Generalized anxiety disorder: Secondary | ICD-10-CM | POA: Diagnosis not present

## 2017-06-29 DIAGNOSIS — J111 Influenza due to unidentified influenza virus with other respiratory manifestations: Secondary | ICD-10-CM

## 2017-06-29 DIAGNOSIS — F4329 Adjustment disorder with other symptoms: Secondary | ICD-10-CM

## 2017-06-29 MED ORDER — ESCITALOPRAM OXALATE 10 MG PO TABS: ORAL_TABLET | ORAL | 2 refills | Status: DC

## 2017-06-29 MED ORDER — DIVALPROEX SODIUM ER 500 MG PO TB24: ORAL_TABLET | ORAL | 2 refills | Status: DC

## 2017-06-29 NOTE — Progress Notes (Signed)
Patient ID: Micheal Estrada, male   DOB: [birth year], 67 y.o.   MRN: [id number]   [facility] [address] Progress Note  Anterio Scheel [id number] 67 y.o.  06/29/2017 10:14 AM  Chief Complaint:  HPI Comments: Mr. Micheal Estrada returns for follow-up and medication management for PTSD depression and anxiety  Patient has multiple medical conditions but most concerning his pain that affects his mood and limitations. Wife is sick and in pain He has flu today to stayed near door and talked about meds Labs reivewed. depakote level 67.   Modifying factors : wife Daughter has her episodes of keeping distance  Sleep fair  Anxiety flucutates  No side effects reported      Review of Systems: No nausea. Depression worse.   Past Medical Family, Social History:  Past Medical History:  Diagnosis Date  . Diabetes mellitus, type II (HCC)   . Hypertension       Outpatient Encounter Medications as of 06/29/2017  Medication Sig  . CELEBREX 200 MG capsule   . cyclobenzaprine (FLEXERIL) 10 MG tablet   . divalproex (DEPAKOTE ER) 500 MG 24 hr tablet TAKE 2 TABLETS (1,000 MG TOTAL) BY MOUTH AT BEDTIME.  Marland Kitchen ENBREL SURECLICK 50 MG/ML injection   . escitalopram (LEXAPRO) 10 MG tablet Take one and one half total of 15mg  .  . fenofibrate 160 MG tablet   . furosemide (LASIX) 20 MG tablet   . hydrOXYzine (ATARAX/VISTARIL) 50 MG tablet Take 50 mg by mouth at bedtime as needed.   Marland Kitchen JANUVIA 100 MG tablet   . lisinopril (PRINIVIL,ZESTRIL) 5 MG tablet   . lovastatin (MEVACOR) 20 MG tablet   . metFORMIN (GLUCOPHAGE-XR) 500 MG 24 hr tablet   . methotrexate (RHEUMATREX) 2.5 MG tablet   . omeprazole (PRILOSEC) 40 MG capsule Reported on 09/26/2015  . oxyCODONE-acetaminophen (PERCOCET) 10-325 MG per tablet Reported on 09/26/2015  . polyethylene glycol powder (GLYCOLAX/MIRALAX) powder   . predniSONE (DELTASONE) 5 MG tablet   . PREVIDENT 5000 BOOSTER PLUS 1.1 % PSTE   . primidone (MYSOLINE) 50  MG tablet   . PROAIR HFA 108 (90 BASE) MCG/ACT inhaler   . SPIRIVA HANDIHALER 18 MCG inhalation capsule   . TRILIPIX 135 MG capsule   . [DISCONTINUED] divalproex (DEPAKOTE ER) 500 MG 24 hr tablet TAKE 2 TABLETS (1,000 MG TOTAL) BY MOUTH AT BEDTIME.  . [DISCONTINUED] escitalopram (LEXAPRO) 10 MG tablet Take one and one half total of 15mg  .   No facility-administered encounter medications on file as of 06/29/2017.       Physical Exam: Constitutional:  BP 112/82 (BP Location: Left Arm, Patient Position: Sitting, Cuff Size: Normal)   Pulse (!) 112   Ht 5\' 5"  (1.651 m)   Wt 148 lb (67.1 kg)   BMI 24.63 kg/m   General Appearance: alert oriented. Difficult to walk. No nausea or chest pain  Psychiatric: General Appearance: Casual and Well Groomed  Eye Contact::  Poor  Speech:  Slow  Volume:  Normal  Mood: subdued, feeling down due to flu as well  Affect:  Congruent cooperative  Thought Process:  Coherent, Linear and Logical  Orientation:  Full (Time, Place, and Person)  Thought Content:  WDL  Suicidal Thoughts:  No  Homicidal Thoughts:  No  Memory:  Immediate;   Good Recent;   Poor Remote;   Fair  Judgement:  Fair  Insight:  Negative and Fair  Psychomotor Activity:  Normal  Concentration:  Fair  Recall:  Poor  Akathisia:  Negative  Handed:  Right  AIMS (if indicated):     Assets:  Desire for Improvement Financial Resources/Insurance Housing Resilience Social Support Transportation      Assessment: Axis I: 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 GAD    Plan:         Medications:  Continue the following psychiatric medications as written prior to this appointment with the following changes::  Mood disorder:depressed. Subdued. But depakote helps. Will continue  Depression: subdued. Continue meds Anxiety : not worse. Continue lexapro.  Provided supportive therapy. Questions addressed Call for concerns   FU 3 months     Routine PRN Medications:  Negative  Consultations: The patient was encouraged to keep all PCP and specialty clinic appointments.          Thresa RossNADEEM Kierrah Kilbride, M.D.  06/29/2017 10:14 AM

## 2017-09-20 ENCOUNTER — Other Ambulatory Visit: Payer: Self-pay

## 2017-09-20 ENCOUNTER — Ambulatory Visit (INDEPENDENT_AMBULATORY_CARE_PROVIDER_SITE_OTHER): Payer: Medicare Other | Admitting: Psychiatry

## 2017-09-20 ENCOUNTER — Encounter (HOSPITAL_COMMUNITY): Payer: Self-pay | Admitting: Psychiatry

## 2017-09-20 VITALS — BP 128/78 | HR 67 | Ht 65.0 in | Wt 147.0 lb

## 2017-09-20 DIAGNOSIS — F0630 Mood disorder due to known physiological condition, unspecified: Secondary | ICD-10-CM

## 2017-09-20 DIAGNOSIS — F331 Major depressive disorder, recurrent, moderate: Secondary | ICD-10-CM | POA: Diagnosis not present

## 2017-09-20 DIAGNOSIS — F411 Generalized anxiety disorder: Secondary | ICD-10-CM

## 2017-09-20 DIAGNOSIS — F4329 Adjustment disorder with other symptoms: Secondary | ICD-10-CM

## 2017-09-20 MED ORDER — DIVALPROEX SODIUM ER 500 MG PO TB24: ORAL_TABLET | ORAL | 3 refills | Status: DC

## 2017-09-20 MED ORDER — ESCITALOPRAM OXALATE 10 MG PO TABS: ORAL_TABLET | ORAL | 3 refills | Status: DC

## 2017-09-20 NOTE — Progress Notes (Signed)
Patient ID: Micheal Estrada, male   DOB: [birth year], 67 y.o.   MRN: [id number]   [facility] [id number] Progress Note  Micheal Estrada [id number] 67 y.o.  09/20/2017 10:15 AM  Chief Complaint:  HPI Comments: Mr. Salls returns for follow-up and medication management for PTSD depression and anxiety  Patient has multiple medical conditions but most concerning his pain that affects his mood and limitations. Wife is sick and in pain Now on pain meds that hellp  depression not worse. Pain can effect mood   Labs reivewed. depakote level 67.   Modifying factors : wife ]Daughter has her episodes of keeping distance  Sleep fair  Anxiety flucutates  No side effects reported      Review of Systems: No nausea. Depression worse.   Past Medical Family, Social History:  Past Medical History:  Diagnosis Date  . Diabetes mellitus, type II (HCC)   . Hypertension       Outpatient Encounter Medications as of 09/20/2017  Medication Sig  . CELEBREX 200 MG capsule   . cyclobenzaprine (FLEXERIL) 10 MG tablet   . divalproex (DEPAKOTE ER) 500 MG 24 hr tablet TAKE 2 TABLETS (1,000 MG TOTAL) BY MOUTH AT BEDTIME.  Marland Kitchen ENBREL SURECLICK 50 MG/ML injection   . escitalopram (LEXAPRO) 10 MG tablet Take one and one half total of  .  . fenofibrate 160 MG tablet   . furosemide (LASIX) 20 MG tablet   . hydrOXYzine (ATARAX/VISTARIL) 50 MG tablet Take 50 mg by mouth at bedtime as needed.   Marland Kitchen JANUVIA 100 MG tablet   . lisinopril (PRINIVIL,ZESTRIL) 5 MG tablet   . lovastatin (MEVACOR) 20 MG tablet   . metFORMIN (GLUCOPHAGE-XR) 500 MG 24 hr tablet   . methotrexate (RHEUMATREX) 2.5 MG tablet   . omeprazole (PRILOSEC) 40 MG capsule Reported on 09/26/2015  . oxyCODONE-acetaminophen (PERCOCET) 10-325 MG per tablet Reported on 09/26/2015  . polyethylene glycol powder (GLYCOLAX/MIRALAX) powder   . predniSONE (DELTASONE) 5 MG tablet   . PREVIDENT 5000 BOOSTER PLUS 1.1 % PSTE   .  primidone (MYSOLINE) 50 MG tablet   . PROAIR HFA 108 (90 BASE) MCG/ACT inhaler   . SPIRIVA HANDIHALER 18 MCG inhalation capsule   . TRILIPIX 135 MG capsule   . [DISCONTINUED] divalproex (DEPAKOTE ER) 500 MG 24 hr tablet TAKE 2 TABLETS (1,000 MG TOTAL) BY MOUTH AT BEDTIME.  . [DISCONTINUED] escitalopram (LEXAPRO) 10 MG tablet Take one and one half total of  .   No facility-administered encounter medications on file as of 09/20/2017.       Physical Exam: Constitutional:  BP 128/78 (BP Location: Left Arm, Patient Position: Sitting, Cuff Size: Normal)   Pulse 67   Ht  (1.651 m)   Wt 147 lb (66.7 kg)   BMI 24.46 kg/m   General Appearance: alert oriented. Difficult to walk. No nausea or chest pain  Psychiatric: General Appearance: Casual and Well Groomed  Eye Contact::  Poor  Speech:  Slow  Volume:  Normal  Mood: subdued  Affect:  Congruent cooperative  Thought Process:  Coherent, Linear and Logical  Orientation:  Full (Time, Place, and Person)  Thought Content:  WDL  Suicidal Thoughts:  No  Homicidal Thoughts:  No  Memory:  Immediate;   Good Recent;   Poor Remote;   Fair  Judgement:  Fair  Insight:  Negative and Fair  Psychomotor Activity:  Normal  Concentration:  Fair  Recall:  Poor  Akathisia:  Negative  Handed:  Right  AIMS (if indicated):     Assets:  Desire for Improvement Financial Resources/Insurance Housing Resilience Social Support Transportation      Assessment: Axis I: 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 GAD    Plan:         Medications:  Continue the following psychiatric medications as written prior to this appointment with the following changes::  Mood disorder: subdued continue lexapro and depakote  Depression: subdued. Continue meds Anxiety : fluctuates. Continue lexapro Provided supportive therapy. Questions addressed Call for concerns  Fu 57m    Routine PRN  Medications:  Negative  Consultations: The patient was encouraged to keep all PCP and specialty clinic appointments.          Thresa Ross, M.D.  09/20/2017 10:15 AM

## 2018-07-10 ENCOUNTER — Other Ambulatory Visit (HOSPITAL_COMMUNITY): Payer: Self-pay | Admitting: Psychiatry

## 2018-08-17 ENCOUNTER — Encounter (HOSPITAL_COMMUNITY): Payer: Self-pay | Admitting: Psychiatry

## 2018-08-17 ENCOUNTER — Ambulatory Visit (INDEPENDENT_AMBULATORY_CARE_PROVIDER_SITE_OTHER): Payer: Medicare Other | Admitting: Psychiatry

## 2018-08-17 ENCOUNTER — Other Ambulatory Visit: Payer: Self-pay

## 2018-08-17 ENCOUNTER — Other Ambulatory Visit (HOSPITAL_COMMUNITY): Payer: Self-pay | Admitting: Psychiatry

## 2018-08-17 DIAGNOSIS — F331 Major depressive disorder, recurrent, moderate: Secondary | ICD-10-CM

## 2018-08-17 DIAGNOSIS — F411 Generalized anxiety disorder: Secondary | ICD-10-CM

## 2018-08-17 DIAGNOSIS — Z79899 Other long term (current) drug therapy: Secondary | ICD-10-CM

## 2018-08-17 DIAGNOSIS — F0630 Mood disorder due to known physiological condition, unspecified: Secondary | ICD-10-CM | POA: Diagnosis not present

## 2018-08-17 DIAGNOSIS — F4329 Adjustment disorder with other symptoms: Secondary | ICD-10-CM

## 2018-08-17 MED ORDER — ESCITALOPRAM OXALATE 10 MG PO TABS: ORAL_TABLET | ORAL | 5 refills | Status: DC

## 2018-08-17 MED ORDER — DIVALPROEX SODIUM ER 500 MG PO TB24: ORAL_TABLET | ORAL | 5 refills | Status: DC

## 2018-08-17 NOTE — Progress Notes (Signed)
Patient ID: Micheal Estrada, male   DOB: December 11, 1950, 68 y.o.   MRN: [id number]   [facility]  Progress Note via Tele medicine  Micheal Estrada [id number] 68 y.o.  08/17/2018 1:48 PM  Chief Complaint:  HPI Comments: Micheal Estrada returns for follow-up and medication management for PTSD depression and anxiety I connected with Micheal Estrada on 08/17/18 at  1:45 PM EDT by telephone and verified that I am speaking with the correct person using two identifiers. This is due to covid 19 limitations and waiver.    I discussed the limitations, risks, security and privacy concerns of performing an evaluation and management service by telephone and the availability of in person appointments. I also discussed with the patient that there may be a patient responsible charge related to this service. The patient expressed understanding and agreed to proceed.  Doing fair. Has multiple medical conditions, wife is supportive but she has sickness herself     depression not worse. Pain can effect mood   Labs have been done in past depakote keeps mood fair. No side effects  Modifying factors : wife Did not elaborate on daughter or her stress  Sleep fair  Anxiety flucutates  No side effects reported      Review of Systems: No nausea. Depression worse.   Past Medical Family, Social History:  Past Medical History:  Diagnosis Date  . Diabetes mellitus, type II (HCC)   . Hypertension       Outpatient Encounter Medications as of 08/17/2018  Medication Sig  . CELEBREX 200 MG capsule   . cyclobenzaprine (FLEXERIL) 10 MG tablet   . divalproex (DEPAKOTE ER) 500 MG 24 hr tablet TAKE 2 TABLETS (1,000 MG TOTAL) BY MOUTH AT BEDTIME.  Marland Kitchen ENBREL SURECLICK 50 MG/ML injection   . escitalopram (LEXAPRO) 10 MG tablet Take one and one half total of 15mg  .  . fenofibrate 160 MG tablet   . furosemide (LASIX) 20 MG tablet   . hydrOXYzine (ATARAX/VISTARIL) 50 MG tablet Take  50 mg by mouth at bedtime as needed.   Marland Kitchen JANUVIA 100 MG tablet   . lisinopril (PRINIVIL,ZESTRIL) 5 MG tablet   . lovastatin (MEVACOR) 20 MG tablet   . metFORMIN (GLUCOPHAGE-XR) 500 MG 24 hr tablet   . methotrexate (RHEUMATREX) 2.5 MG tablet   . omeprazole (PRILOSEC) 40 MG capsule Reported on 09/26/2015  . oxyCODONE-acetaminophen (PERCOCET) 10-325 MG per tablet Reported on 09/26/2015  . polyethylene glycol powder (GLYCOLAX/MIRALAX) powder   . predniSONE (DELTASONE) 5 MG tablet   . PREVIDENT 5000 BOOSTER PLUS 1.1 % PSTE   . primidone (MYSOLINE) 50 MG tablet   . PROAIR HFA 108 (90 BASE) MCG/ACT inhaler   . SPIRIVA HANDIHALER 18 MCG inhalation capsule   . TRILIPIX 135 MG capsule   . [DISCONTINUED] divalproex (DEPAKOTE ER) 500 MG 24 hr tablet TAKE 2 TABLETS (1,000 MG TOTAL) BY MOUTH AT BEDTIME.  . [DISCONTINUED] escitalopram (LEXAPRO) 10 MG tablet Take one and one half total of 15mg  .   No facility-administered encounter medications on file as of 08/17/2018.       Physical Exam: Constitutional:  There were no vitals taken for this visit.  General Appearance: alert oriented. Difficult to walk. No nausea or chest pain  Psychiatric: General Appearance:   Eye Contact::   Speech:  Slow  Volume:  Normal  Mood: fair  Affect:  Congruent cooperative  Thought Process:  Coherent, Linear and Logical  Orientation:  Full (Time, Place, and Person)  Thought Content:  WDL  Suicidal Thoughts:  No  Homicidal Thoughts:  No  Memory:  Immediate;   Good Recent;   Poor Remote;   Fair  Judgement:  Fair  Insight:  Negative and Fair  Psychomotor Activity:    Concentration:  Fair  Recall:  Poor  Akathisia:  Negative  Handed:  Right  AIMS (if indicated):     Assets:  Desire for Improvement Financial Resources/Insurance Housing Resilience Social Support Transportation      Assessment: Axis I: 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  GAD    Plan:         Medications:  Continue the following psychiatric medications as written prior to this appointment with the following changes::  Mood disorder: doing fair. Continue lexapro and depakote Due to covid virus, will request for labs later  Depression: doing fair. Continue lexapro Anxiety : fluctuates. Continue lexapro Provided supportive therapy. Questions addressed Call for concerns   I discussed the assessment and treatment plan with the patient. The patient was provided an opportunity to ask questions and all were answered. The patient agreed with the plan and demonstrated an understanding of the instructions.   The patient was advised to call back or seek an in-person evaluation if the symptoms worsen or if the condition fails to improve as anticipated.  I provided15 minutes of non-face-to-face time during this encounter.  Fu 55m.     Routine PRN Medications:  Negative           Thresa Ross, M.D.  08/17/2018 1:48 PM

## 2019-01-31 ENCOUNTER — Telehealth (HOSPITAL_COMMUNITY): Payer: Self-pay | Admitting: Psychiatry

## 2019-01-31 MED ORDER — ESCITALOPRAM OXALATE 10 MG PO TABS: ORAL_TABLET | ORAL | 0 refills | Status: DC

## 2019-01-31 NOTE — Telephone Encounter (Signed)
From pharmacy Refill on escitalopram 10mg   [facility] [place]

## 2019-01-31 NOTE — Telephone Encounter (Signed)
sent 

## 2019-02-16 ENCOUNTER — Other Ambulatory Visit (HOSPITAL_COMMUNITY): Payer: Self-pay

## 2019-02-19 ENCOUNTER — Ambulatory Visit (HOSPITAL_COMMUNITY): Payer: Medicare Other | Admitting: Psychiatry

## 2019-02-21 ENCOUNTER — Ambulatory Visit (INDEPENDENT_AMBULATORY_CARE_PROVIDER_SITE_OTHER): Payer: Medicare Other | Admitting: Psychiatry

## 2019-02-21 ENCOUNTER — Encounter (HOSPITAL_COMMUNITY): Payer: Self-pay | Admitting: Psychiatry

## 2019-02-21 ENCOUNTER — Other Ambulatory Visit: Payer: Self-pay

## 2019-02-21 DIAGNOSIS — F0630 Mood disorder due to known physiological condition, unspecified: Secondary | ICD-10-CM

## 2019-02-21 DIAGNOSIS — F4329 Adjustment disorder with other symptoms: Secondary | ICD-10-CM

## 2019-02-21 DIAGNOSIS — F411 Generalized anxiety disorder: Secondary | ICD-10-CM

## 2019-02-21 MED ORDER — DIVALPROEX SODIUM ER 500 MG PO TB24: ORAL_TABLET | ORAL | 5 refills | Status: DC

## 2019-02-21 NOTE — Progress Notes (Signed)
Patient ID: Micheal Estrada, male   DOB: Jun 12, 1950, 68 y.o.   MRN: [id number]   [facility]  Progress Note via Tele medicine  Qunicy Higinbotham [id number] 68 y.o.  02/21/2019 8:40 AM  Chief Complaint:  HPI Comments: Mr. Depaz returns for follow-up and medication management for PTSD depression and anxiety    I connected with [name] on 02/21/19 at  8:30 AM EDT by telephone and verified that I am speaking with the correct person using two identifiers.   I discussed the limitations, risks, security and privacy concerns of performing an evaluation and management service by telephone and the availability of in person appointments. I also discussed with the patient that there may be a patient responsible charge related to this service. The patient expressed understanding and agreed to proceed.    Doing fair, pain effects mood and mobility, wife is supportive no side effects. Says will depakote level later, no tremors       depression not worse.  Modifying factors : wife Did not elaborate on daughter or her stress  Sleep fair  Anxiety manageable   Review of Systems: No nausea, no tremors  Past Medical Family, Social History:  Past Medical History:  Diagnosis Date  . Diabetes mellitus, type II (HCC)   . Hypertension       Outpatient Encounter Medications as of 02/21/2019  Medication Sig  . CELEBREX 200 MG capsule   . cyclobenzaprine (FLEXERIL) 10 MG tablet   . divalproex (DEPAKOTE ER) 500 MG 24 hr tablet TAKE 2 TABLETS (1,000 MG TOTAL) BY MOUTH AT BEDTIME.  Marland Kitchen ENBREL SURECLICK 50 MG/ML injection   . escitalopram (LEXAPRO) 10 MG tablet TAKE 1.5TABLETS BY MOUTH EVERY DAY  . fenofibrate 160 MG tablet   . furosemide (LASIX) 20 MG tablet   . hydrOXYzine (ATARAX/VISTARIL) 50 MG tablet Take 50 mg by mouth at bedtime as needed.   Marland Kitchen JANUVIA 100 MG tablet   . lisinopril (PRINIVIL,ZESTRIL) 5 MG tablet   . lovastatin (MEVACOR) 20 MG tablet    . metFORMIN (GLUCOPHAGE-XR) 500 MG 24 hr tablet   . methotrexate (RHEUMATREX) 2.5 MG tablet   . omeprazole (PRILOSEC) 40 MG capsule Reported on 09/26/2015  . oxyCODONE-acetaminophen (PERCOCET) 10-325 MG per tablet Reported on 09/26/2015  . polyethylene glycol powder (GLYCOLAX/MIRALAX) powder   . predniSONE (DELTASONE) 5 MG tablet   . PREVIDENT 5000 BOOSTER PLUS 1.1 % PSTE   . primidone (MYSOLINE) 50 MG tablet   . PROAIR HFA 108 (90 BASE) MCG/ACT inhaler   . SPIRIVA HANDIHALER 18 MCG inhalation capsule   . TRILIPIX 135 MG capsule   . [DISCONTINUED] divalproex (DEPAKOTE ER) 500 MG 24 hr tablet TAKE 2 TABLETS (1,000 MG TOTAL) BY MOUTH AT BEDTIME.   No facility-administered encounter medications on file as of 02/21/2019.       Physical Exam: Constitutional:  There were no vitals taken for this visit.  General Appearance: alert oriented. Difficult to walk. No nausea or chest pain  Psychiatric: General Appearance:   Eye Contact::   Speech:  Slow  Volume:  Normal  Mood: fair  Affect:  Congruent cooperative  Thought Process:  Coherent, Linear and Logical  Orientation:  Full (Time, Place, and Person)  Thought Content:  WDL  Suicidal Thoughts:  No  Homicidal Thoughts:  No  Memory:  Immediate;   Good Recent;   Poor Remote;   Fair  Judgement:  Fair  Insight:  Negative and Fair  Psychomotor Activity:  Concentration:  Fair  Recall:  Poor  Akathisia:  Negative  Handed:  Right  AIMS (if indicated):     Assets:  Desire for Improvement Financial Resources/Insurance Housing Resilience Social Support Transportation      Assessment: Axis I: 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 GAD    Plan:         Medications:  Continue the following psychiatric medications as written prior to this appointment with the following changes::  Mood disorder: fair, continue depaote, lexapro Due to covid virus, labs deffered by  patient Depression: doing fair. Continue lexapro Anxiety : fluctuates. Continue lexapro Provided supportive therapy. Questions addressed Call for concerns   I discussed the assessment and treatment plan with the patient. The patient was provided an opportunity to ask questions and all were answered. The patient agreed with the plan and demonstrated an understanding of the instructions.   The patient was advised to call back or seek an in-person evaluation if the symptoms worsen or if the condition fails to improve as anticipated.  I provided15 minutes of non-face-to-face time during this encounter.  Fu 47m.     Routine PRN Medications:  Negative           Merian Capron, M.D.  02/21/2019 8:40 AM

## 2019-05-02 ENCOUNTER — Other Ambulatory Visit (HOSPITAL_COMMUNITY): Payer: Self-pay

## 2019-05-02 MED ORDER — ESCITALOPRAM OXALATE 10 MG PO TABS: ORAL_TABLET | ORAL | 1 refills | Status: DC

## 2019-05-25 ENCOUNTER — Other Ambulatory Visit (HOSPITAL_COMMUNITY): Payer: Self-pay | Admitting: Psychiatry

## 2019-06-01 ENCOUNTER — Other Ambulatory Visit (HOSPITAL_COMMUNITY): Payer: Self-pay | Admitting: Psychiatry

## 2019-06-21 ENCOUNTER — Ambulatory Visit (INDEPENDENT_AMBULATORY_CARE_PROVIDER_SITE_OTHER): Payer: Medicare Other | Admitting: Psychiatry

## 2019-06-21 ENCOUNTER — Encounter (HOSPITAL_COMMUNITY): Payer: Self-pay | Admitting: Psychiatry

## 2019-06-21 DIAGNOSIS — F0630 Mood disorder due to known physiological condition, unspecified: Secondary | ICD-10-CM

## 2019-06-21 DIAGNOSIS — F411 Generalized anxiety disorder: Secondary | ICD-10-CM

## 2019-06-21 DIAGNOSIS — F331 Major depressive disorder, recurrent, moderate: Secondary | ICD-10-CM | POA: Diagnosis not present

## 2019-06-21 NOTE — Progress Notes (Signed)
Patient ID: Micheal Estrada, male   DOB: 29-Nov-1950, 69 y.o.   MRN: [id number]   [facility]  Progress Note via [place] [id number] 69 y.o.  06/21/2019 10:12 AM  Chief Complaint:  HPI Comments: Micheal Estrada returns for follow-up and medication management for PTSD depression and anxiety    I connected with Micheal Estrada on 06/21/19 at 10:00 AM EST by telephone and verified that I am speaking with the correct person using two identifiers.   I discussed the limitations, risks, security and privacy concerns of performing an evaluation and management service by telephone and the availability of in person appointments. I also discussed with the patient that there may be a patient responsible charge related to this service. The patient expressed understanding and agreed to proceed.    Doing fair, pain effects mood and mobility, wife is supportive no side effects. Says will depakote level later, no tremors    depression is fair, stays mostly at home  Modifying factors : wife Did not elaborate on daughter or her stress  Sleep fair Anxiety manageable   Review of Systems: No nausea, no tremors  Past Medical Family, Social History:  Past Medical History:  Diagnosis Date  . Diabetes mellitus, type II ([place])   . Hypertension       Outpatient Encounter Medications as of 06/21/2019  Medication Sig  . CELEBREX 200 MG capsule   . cyclobenzaprine (FLEXERIL) 10 MG tablet   . divalproex (DEPAKOTE ER) 500 MG 24 hr tablet TAKE 2 TABLETS (1,000 MG TOTAL) BY MOUTH AT BEDTIME.  Marland Kitchen ENBREL SURECLICK 50 MG/ML injection   . escitalopram (LEXAPRO) 10 MG tablet TAKE 1 AND 1/2 TABLET BY MOUTH EVERY DAY  . fenofibrate 160 MG tablet   . furosemide (LASIX) 20 MG tablet   . JANUVIA 100 MG tablet   . lisinopril (PRINIVIL,ZESTRIL) 5 MG tablet   . lovastatin (MEVACOR) 20 MG tablet   . metFORMIN (GLUCOPHAGE-XR) 500 MG 24 hr tablet   . methotrexate  (RHEUMATREX) 2.5 MG tablet   . omeprazole (PRILOSEC) 40 MG capsule Reported on 09/26/2015  . oxyCODONE-acetaminophen (PERCOCET) 10-325 MG per tablet Reported on 09/26/2015  . polyethylene glycol powder (GLYCOLAX/MIRALAX) powder   . predniSONE (DELTASONE) 5 MG tablet   . PREVIDENT 5000 BOOSTER PLUS 1.1 % PSTE   . primidone (MYSOLINE) 50 MG tablet   . PROAIR HFA 108 (90 BASE) MCG/ACT inhaler   . SPIRIVA HANDIHALER 18 MCG inhalation capsule   . TRILIPIX 135 MG capsule   . [DISCONTINUED] hydrOXYzine (ATARAX/VISTARIL) 50 MG tablet Take 50 mg by mouth at bedtime as needed.    No facility-administered encounter medications on file as of 06/21/2019.      Physical Exam: Constitutional:  There were no vitals taken for this visit.  General Appearance: alert oriented. Difficult to walk. No nausea or chest pain  Psychiatric: General Appearance:   Eye Contact::   Speech:  Slow  Volume:  Normal  Mood: fair  Affect:  Congruent cooperative  Thought Process:  Coherent, Linear and Logical  Orientation:  Full (Time, Place, and Person)  Thought Content:  WDL  Suicidal Thoughts:  No  Homicidal Thoughts:  No  Memory:  Immediate;   Good Recent;   Poor Remote;   Fair  Judgement:  Fair  Insight:  Negative and Fair  Psychomotor Activity:    Concentration:  Fair  Recall:  Poor  Akathisia:  Negative  Handed:  Right  AIMS (if  indicated):     Assets:  Desire for Improvement Financial Resources/Insurance Housing Resilience Social Support Transportation      Assessment: Axis I: 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 GAD    Plan:         Medications:  Continue the following psychiatric medications as written prior to this appointment with the following changes::  Mood disorder: fair, continue depakote. No side effects understands to get labs done Due to pandemic, labs deffered by patient Depression: doing fair. Continue  lexapro Anxiety : fluctuates. Continue lexapro Provided supportive therapy. Questions addressed Call for concerns   I discussed the assessment and treatment plan with the patient. The patient was provided an opportunity to ask questions and all were answered. The patient agreed with the plan and demonstrated an understanding of the instructions.   The patient was advised to call back or seek an in-person evaluation if the symptoms worsen or if the condition fails to improve as anticipated.  I provided15 minutes of non-face-to-face time during this encounter.  Fu 4-34m.     Routine PRN Medications:  Negative           Thresa Ross, M.D.  06/21/2019 10:12 AM

## 2019-10-19 ENCOUNTER — Telehealth (INDEPENDENT_AMBULATORY_CARE_PROVIDER_SITE_OTHER): Payer: Medicare Other | Admitting: Psychiatry

## 2019-10-19 ENCOUNTER — Encounter (HOSPITAL_COMMUNITY): Payer: Self-pay | Admitting: Psychiatry

## 2019-10-19 DIAGNOSIS — F411 Generalized anxiety disorder: Secondary | ICD-10-CM | POA: Diagnosis not present

## 2019-10-19 DIAGNOSIS — F0630 Mood disorder due to known physiological condition, unspecified: Secondary | ICD-10-CM | POA: Diagnosis not present

## 2019-10-19 MED ORDER — ESCITALOPRAM OXALATE 10 MG PO TABS: ORAL_TABLET | ORAL | 1 refills | Status: DC

## 2019-10-19 MED ORDER — DIVALPROEX SODIUM ER 500 MG PO TB24: ORAL_TABLET | ORAL | 0 refills | Status: DC

## 2019-10-19 NOTE — Progress Notes (Signed)
Patient ID: Micheal Estrada, male   DOB: 07/26/1950, 69 y.o.   MRN: [id number]   [facility]  Progress Note via [place] [phone] 69 y.o.  10/19/2019 12:02 PM  Chief Complaint:  HPI Comments: Mr. Micheal Estrada returns for follow-up and medication management for PTSD depression and anxiety   I connected with Ronette Deter on 10/19/19 at 12:00 PM EDT by telephone and verified that I am speaking with the correct person using two identifiers.  patient location : home Provider location : home office I discussed the limitations, risks, security and privacy concerns of performing an evaluation and management service by telephone and the availability of in person appointments. I also discussed with the patient that there may be a patient responsible charge related to this service. The patient expressed understanding and agreed to proceed.    Doing fair, wife supportive, takes pain meds as pain effects mood Spends time with grandkid   depression is fair, stays mostly at home  Modifying factors : wife Did not elaborate on daughter or her stress  Sleep fair Anxiety manageable Have not got depakote level, will write today  Review of Systems: No nausea, no tremors  Past Medical Family, Social History:  Past Medical History:  Diagnosis Date  . Diabetes mellitus, type II ([place])   . Hypertension       Outpatient Encounter Medications as of 10/19/2019  Medication Sig  . CELEBREX 200 MG capsule   . cyclobenzaprine (FLEXERIL) 10 MG tablet   . divalproex (DEPAKOTE ER) 500 MG 24 hr tablet Take at night  . ENBREL SURECLICK 50 MG/ML injection   . escitalopram (LEXAPRO) 10 MG tablet TAKE 1 AND 1/2 TABLET BY MOUTH EVERY DAY  . fenofibrate 160 MG tablet   . furosemide (LASIX) 20 MG tablet   . JANUVIA 100 MG tablet   . lisinopril (PRINIVIL,ZESTRIL) 5 MG tablet   . lovastatin (MEVACOR) 20 MG tablet   . metFORMIN (GLUCOPHAGE-XR) 500 MG  24 hr tablet   . methotrexate (RHEUMATREX) 2.5 MG tablet   . omeprazole (PRILOSEC) 40 MG capsule Reported on 09/26/2015  . oxyCODONE-acetaminophen (PERCOCET) 10-325 MG per tablet Reported on 09/26/2015  . polyethylene glycol powder (GLYCOLAX/MIRALAX) powder   . predniSONE (DELTASONE) 5 MG tablet   . PREVIDENT 5000 BOOSTER PLUS 1.1 % PSTE   . primidone (MYSOLINE) 50 MG tablet   . PROAIR HFA 108 (90 BASE) MCG/ACT inhaler   . SPIRIVA HANDIHALER 18 MCG inhalation capsule   . TRILIPIX 135 MG capsule   . [DISCONTINUED] divalproex (DEPAKOTE ER) 500 MG 24 hr tablet TAKE 2 TABLETS (1,000 MG TOTAL) BY MOUTH AT BEDTIME.  . [DISCONTINUED] escitalopram (LEXAPRO) 10 MG tablet TAKE 1 AND 1/2 TABLET BY MOUTH EVERY DAY   No facility-administered encounter medications on file as of 10/19/2019.      Physical Exam: Constitutional:  There were no vitals taken for this visit.  General Appearance: alert oriented. Difficult to walk. No nausea or chest pain  Psychiatric: General Appearance:   Eye Contact::   Speech:  Slow  Volume:  Normal  Mood: fair  Affect:  Congruent cooperative  Thought Process:  Coherent, Linear and Logical  Orientation:  Full (Time, Place, and Person)  Thought Content:  WDL  Suicidal Thoughts:  No  Homicidal Thoughts:  No  Memory:  Immediate;   Good Recent;   Poor Remote;   Fair  Judgement:  Fair  Insight:  Negative and Fair  Psychomotor Activity:  Concentration:  Fair  Recall:  Poor  Akathisia:  Negative  Handed:  Right  AIMS (if indicated):     Assets:  Desire for Improvement Financial Resources/Insurance Housing Resilience Social Support Transportation      Assessment: Axis I: 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 GAD    Plan:         Medications:  Continue the following psychiatric medications as written prior to this appointment with the following changes::  Mood disorder: fair, continue  depakote, will send blood level request and encouraged to get that done no side effects reported Depression: doing fair. Continue lexapro Anxiety : fluctuates. Continue lexapro Provided supportive therapy. Questions addressed Call for concerns   I discussed the assessment and treatment plan with the patient. The patient was provided an opportunity to ask questions and all were answered. The patient agreed with the plan and demonstrated an understanding of the instructions.   The patient was advised to call back or seek an in-person evaluation if the symptoms worsen or if the condition fails to improve as anticipated.  I provided15 minutes of non-face-to-face time during this encounter.  Fu 72m.     Routine PRN Medications:  Negative           Thresa Ross, M.D.  10/19/2019 12:02 PM

## 2019-10-23 ENCOUNTER — Telehealth (HOSPITAL_COMMUNITY): Payer: Self-pay

## 2019-10-23 MED ORDER — DIVALPROEX SODIUM ER 500 MG PO TB24: ORAL_TABLET | ORAL | 0 refills | Status: DC

## 2019-10-23 NOTE — Telephone Encounter (Signed)
Take 2 of 500mg   . Total dose of 1000mg  at night depakote

## 2019-10-23 NOTE — Telephone Encounter (Signed)
They want you to resend the rx in the system please.

## 2019-10-23 NOTE — Telephone Encounter (Signed)
Pharmacy sent fax stating that they need you to provide the correct directions on Depakote rx. [facility] [address] in [place]

## 2019-10-23 NOTE — Telephone Encounter (Signed)
[name] can you call patient , not sure what is the confusion  but lets re confirm.Micheal Estrada ask patient is he taking one or two of 500mg  depakote and I will re order.  Thanks

## 2019-10-23 NOTE — Telephone Encounter (Signed)
Spoke to patient and fixed rx in system. Nothing further is needed

## 2020-03-11 ENCOUNTER — Other Ambulatory Visit (HOSPITAL_COMMUNITY): Payer: Self-pay | Admitting: Psychiatry

## 2020-04-22 ENCOUNTER — Telehealth (HOSPITAL_COMMUNITY): Payer: Medicare Other | Admitting: Psychiatry

## 2020-05-29 ENCOUNTER — Telehealth (HOSPITAL_COMMUNITY): Payer: Medicare Other | Admitting: Psychiatry

## 2020-06-10 ENCOUNTER — Encounter (HOSPITAL_COMMUNITY): Payer: Self-pay | Admitting: Psychiatry

## 2020-06-10 ENCOUNTER — Telehealth (INDEPENDENT_AMBULATORY_CARE_PROVIDER_SITE_OTHER): Payer: Medicare Other | Admitting: Psychiatry

## 2020-06-10 DIAGNOSIS — F411 Generalized anxiety disorder: Secondary | ICD-10-CM

## 2020-06-10 DIAGNOSIS — F0630 Mood disorder due to known physiological condition, unspecified: Secondary | ICD-10-CM | POA: Diagnosis not present

## 2020-06-10 MED ORDER — ESCITALOPRAM OXALATE 10 MG PO TABS: ORAL_TABLET | ORAL | 1 refills | Status: DC

## 2020-06-10 NOTE — Progress Notes (Signed)
Patient ID: Micheal Estrada, male   DOB: 01/12/1951, 70 y.o.   MRN: [id number]   [facility]  Progress Note via Tele medicine  Micheal Estrada [id number] 70 y.o.  06/10/2020 8:42 AM  Chief Complaint:  HPI Comments: Micheal Estrada returns for follow-up and medication management for PTSD depression and anxiet  I connected with Ayodeji Keimig on 06/10/20 at  8:30 AM EST by telephone and verified that I am speaking with the correct person using two identifiers.  Location: Patient: home  Provider: home I discussed the limitations, risks, security and privacy concerns of performing an evaluation and management service by telephone and the availability of in person appointments. I also discussed with the patient that there may be a patient responsible charge related to this service. The patient expressed understanding and agreed to proceed.    Pain effecting mood, lies in bed mostly he feels depakote was raising blood sugar so he stopped,  Wife is supportive lexapro helps mood but pain and aches are difficult to manage, he is on opiods   stays mostly at home  Modifying factors : wife Did not elaborate on daughter or her stress  Sleep fair, stays in bed Anxiety; worries related to health   Review of Systems: No nausea, no tremors  Past Medical Family, Social History:  Past Medical History:  Diagnosis Date  . Diabetes mellitus, type II (HCC)   . Hypertension       Outpatient Encounter Medications as of 06/10/2020  Medication Sig  . CELEBREX 200 MG capsule   . cyclobenzaprine (FLEXERIL) 10 MG tablet   . ENBREL SURECLICK 50 MG/ML injection   . escitalopram (LEXAPRO) 10 MG tablet TAKE 1 AND 1/2 TABLET BY MOUTH EVERY DAY  . fenofibrate 160 MG tablet   . furosemide (LASIX) 20 MG tablet   . JANUVIA 100 MG tablet   . lisinopril (PRINIVIL,ZESTRIL) 5 MG tablet   . lovastatin (MEVACOR) 20 MG tablet   . metFORMIN (GLUCOPHAGE-XR) 500 MG 24 hr  tablet   . methotrexate (RHEUMATREX) 2.5 MG tablet   . omeprazole (PRILOSEC) 40 MG capsule Reported on 09/26/2015  . oxyCODONE-acetaminophen (PERCOCET) 10-325 MG per tablet Reported on 09/26/2015  . polyethylene glycol powder (GLYCOLAX/MIRALAX) powder   . predniSONE (DELTASONE) 5 MG tablet   . PREVIDENT 5000 BOOSTER PLUS 1.1 % PSTE   . primidone (MYSOLINE) 50 MG tablet   . PROAIR HFA 108 (90 BASE) MCG/ACT inhaler   . SPIRIVA HANDIHALER 18 MCG inhalation capsule   . TRILIPIX 135 MG capsule   . [DISCONTINUED] divalproex (DEPAKOTE ER) 500 MG 24 hr tablet TAKE 2 TABLETS BY MOUTH AT BEDTIME  . [DISCONTINUED] escitalopram (LEXAPRO) 10 MG tablet TAKE 1 AND 1/2 TABLET BY MOUTH EVERY DAY   No facility-administered encounter medications on file as of 06/10/2020.      Physical Exam: Constitutional:  There were no vitals taken for this visit.  General Appearance: alert oriented. Difficult to walk. No nausea or chest pain  Psychiatric: General Appearance:   Eye Contact::   Speech:  Slow  Volume:  Normal  Mood:subdued  Affect:  Congruent cooperative  Thought Process:  Coherent, Linear and Logical  Orientation:  Full (Time, Place, and Person)  Thought Content:  WDL  Suicidal Thoughts:  No  Homicidal Thoughts:  No  Memory:  Immediate;   Good Recent;   Poor Remote;   Fair  Judgement:  Fair  Insight:  Negative and Fair  Psychomotor Activity:  Concentration:  Fair  Recall:  Poor  Akathisia:  Negative  Handed:  Right  AIMS (if indicated):     Assets:  Desire for Improvement Financial Resources/Insurance Housing Resilience Social Support Transportation      Assessment: Axis I: 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 GAD    Plan:         Medications:  Continue the following psychiatric medications as written prior to this appointment with the following changes::  Mood disorder: subdued, continue lexparo, manage  pain and increase activitites Depression: subdued due to pain, following with providers, continue lexapro and provided supportive hterapy Anxiety : fluctuates. Continue lexapro  Call for concerns   I discussed the assessment and treatment plan with the patient. The patient was provided an opportunity to ask questions and all were answered. The patient agreed with the plan and demonstrated an understanding of the instructions.   The patient was advised to call back or seek an in-person evaluation if the symptoms worsen or if the condition fails to improve as anticipated.  I provided14 minutes of non-face-to-face time during this encounter.  Fu 10m.      Routine PRN Medications:  Negative           Thresa Ross, M.D.  06/10/2020 8:42 AM

## 2020-09-08 ENCOUNTER — Telehealth (INDEPENDENT_AMBULATORY_CARE_PROVIDER_SITE_OTHER): Payer: Medicare Other | Admitting: Psychiatry

## 2020-09-08 ENCOUNTER — Encounter (HOSPITAL_COMMUNITY): Payer: Self-pay | Admitting: Psychiatry

## 2020-09-08 DIAGNOSIS — F411 Generalized anxiety disorder: Secondary | ICD-10-CM | POA: Diagnosis not present

## 2020-09-08 DIAGNOSIS — F0630 Mood disorder due to known physiological condition, unspecified: Secondary | ICD-10-CM | POA: Diagnosis not present

## 2020-09-08 MED ORDER — ESCITALOPRAM OXALATE 10 MG PO TABS: ORAL_TABLET | ORAL | 1 refills | Status: DC

## 2020-09-08 NOTE — Progress Notes (Signed)
Patient ID: Micheal Estrada, male   DOB: 1951/01/25, 70 y.o.   MRN: [id number]   [facility]  Progress Note via Tele medicine  Micheal Estrada [id number] 70 y.o.  09/08/2020 1:43 PM  Chief Complaint: follow up depression Virtual Visit via Telephone Note  I connected with Micheal Estrada on 09/08/20 at  1:00 PM EDT by telephone and verified that I am speaking with the correct person using two identifiers.  Location: Patient: home with wife Provider: home office   I discussed the limitations, risks, security and privacy concerns of performing an evaluation and management service by telephone and the availability of in person appointments. I also discussed with the patient that there may be a patient responsible charge related to this service. The patient expressed understanding and agreed to proceed.    I discussed the assessment and treatment plan with the patient. The patient was provided an opportunity to ask questions and all were answered. The patient agreed with the plan and demonstrated an understanding of the instructions.   The patient was advised to call back or seek an in-person evaluation if the symptoms worsen or if the condition fails to improve as anticipated.  I provided 11 minutes of non-face-to-face time during this encounter.    HPI Comments: Micheal Estrada returns for follow-up and medication management for PTSD depression and anxiet  Has difficulty ambulating due to pain, is on pain meds Depression fluctuates due to pain but lexapro helps Has supportive wife   stays mostly at home  Modifying factors : wife Did not elaborate on daughter or her stress  Sleep fair, stays in bed Anxiety; worries related to health   Review of Systems: No nausea, no tremors  Past Medical Family, Social History:  Past Medical History:  Diagnosis Date  . Diabetes mellitus, type II (HCC)   . Hypertension       Outpatient Encounter  Medications as of 09/08/2020  Medication Sig  . CELEBREX 200 MG capsule   . cyclobenzaprine (FLEXERIL) 10 MG tablet   . ENBREL SURECLICK 50 MG/ML injection   . escitalopram (LEXAPRO) 10 MG tablet TAKE 1 AND 1/2 TABLET BY MOUTH EVERY DAY  . fenofibrate 160 MG tablet   . furosemide (LASIX) 20 MG tablet   . JANUVIA 100 MG tablet   . lisinopril (PRINIVIL,ZESTRIL) 5 MG tablet   . lovastatin (MEVACOR) 20 MG tablet   . metFORMIN (GLUCOPHAGE-XR) 500 MG 24 hr tablet   . methotrexate (RHEUMATREX) 2.5 MG tablet   . omeprazole (PRILOSEC) 40 MG capsule Reported on 09/26/2015  . oxyCODONE-acetaminophen (PERCOCET) 10-325 MG per tablet Reported on 09/26/2015  . polyethylene glycol powder (GLYCOLAX/MIRALAX) powder   . predniSONE (DELTASONE) 5 MG tablet   . PREVIDENT 5000 BOOSTER PLUS 1.1 % PSTE   . primidone (MYSOLINE) 50 MG tablet   . PROAIR HFA 108 (90 BASE) MCG/ACT inhaler   . SPIRIVA HANDIHALER 18 MCG inhalation capsule   . TRILIPIX 135 MG capsule   . [DISCONTINUED] divalproex (DEPAKOTE ER) 500 MG 24 hr tablet TAKE 2 TABLETS BY MOUTH AT BEDTIME  . [DISCONTINUED] escitalopram (LEXAPRO) 10 MG tablet TAKE 1 AND 1/2 TABLET BY MOUTH EVERY DAY   No facility-administered encounter medications on file as of 09/08/2020.      Physical Exam: Constitutional:  There were no vitals taken for this visit.  General Appearance: alert oriented. Difficult to walk. No nausea or chest pain  Psychiatric: General Appearance:   Eye Contact::   Speech:  Slow  Volume:  Normal  Mood: somewhat subdued  Affect:  Congruent cooperative  Thought Process:  Coherent, Linear and Logical  Orientation:  Full (Time, Place, and Person)  Thought Content:  WDL  Suicidal Thoughts:  No  Homicidal Thoughts:  No  Memory:  Immediate;   Good Recent;   Poor Remote;   Fair  Judgement:  Fair  Insight:  Negative and Fair  Psychomotor Activity:    Concentration:  Fair  Recall:  Poor  Akathisia:  Negative  Handed:  Right  AIMS (if  indicated):     Assets:  Desire for Improvement Financial Resources/Insurance Housing Resilience Social Support Transportation      Assessment: Axis I: 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 GAD    Plan:         Medications:   Mood disorder:  Somewhat subdued due to pain but feels meds are doing what they can. Continue lexapro 15mg  Follow up with pain clinic  provided supportive hterapy Anxiety : fluctuates. Continue lexapro  Call for concerns   Fu 50m.      Routine PRN Medications:  Negative           1m, M.D.  09/08/2020 1:43 PM

## 2020-12-09 ENCOUNTER — Other Ambulatory Visit: Payer: Self-pay

## 2020-12-09 ENCOUNTER — Ambulatory Visit (INDEPENDENT_AMBULATORY_CARE_PROVIDER_SITE_OTHER): Payer: Medicare Other | Admitting: Psychiatry

## 2020-12-09 ENCOUNTER — Encounter (HOSPITAL_COMMUNITY): Payer: Self-pay | Admitting: Psychiatry

## 2020-12-09 DIAGNOSIS — F411 Generalized anxiety disorder: Secondary | ICD-10-CM | POA: Diagnosis not present

## 2020-12-09 DIAGNOSIS — F0630 Mood disorder due to known physiological condition, unspecified: Secondary | ICD-10-CM

## 2020-12-09 DIAGNOSIS — F4329 Adjustment disorder with other symptoms: Secondary | ICD-10-CM

## 2020-12-09 MED ORDER — ESCITALOPRAM OXALATE 10 MG PO TABS: ORAL_TABLET | ORAL | 1 refills | Status: DC
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
## 2021-06-11 ENCOUNTER — Encounter (HOSPITAL_COMMUNITY): Payer: Self-pay | Admitting: Psychiatry

## 2021-06-11 ENCOUNTER — Ambulatory Visit (INDEPENDENT_AMBULATORY_CARE_PROVIDER_SITE_OTHER): Payer: Medicare Other | Admitting: Psychiatry

## 2021-06-11 VITALS — BP 108/70 | Temp 97.6°F | Ht 65.0 in | Wt 141.0 lb

## 2021-06-11 DIAGNOSIS — F411 Generalized anxiety disorder: Secondary | ICD-10-CM

## 2021-06-11 DIAGNOSIS — G8929 Other chronic pain: Secondary | ICD-10-CM

## 2021-06-11 DIAGNOSIS — F0630 Mood disorder due to known physiological condition, unspecified: Secondary | ICD-10-CM

## 2021-06-11 MED ORDER — ESCITALOPRAM OXALATE 10 MG PO TABS
ORAL_TABLET | ORAL | 1 refills | Status: DC
Start: 2021-06-11 — End: 2021-12-03

## 2021-06-11 NOTE — Progress Notes (Signed)
Patient ID: Stellan Ferman, male   DOB: 05-Feb-1951, 71 y.o.   MRN: [id number]   [facility]  Progress Note via [place] [id number] 71 y.o.  06/11/2021 2:13 PM  Chief Complaint: follow up depression     HPI Comments: Mr. Salamon returns for follow-up and medication management for PTSD depression and anxiet  Improved pain with ear infection treatment with steroids, still on pain meds but not using cane and feels better Wife supportive depression better   Not using THC and is on pain meds   Modifying factors : wife Did not elaborate on daughter   Severity improved  Review of Systems: Denies nausea, tremor  Past Medical Family, Social History:  Past Medical History:  Diagnosis Date   Diabetes mellitus, type II ([place])    Hypertension       Outpatient Encounter Medications as of 06/11/2021  Medication Sig   CELEBREX 200 MG capsule    cyclobenzaprine (FLEXERIL) 10 MG tablet    ENBREL SURECLICK 50 MG/ML injection    escitalopram (LEXAPRO) 10 MG tablet TAKE 1 AND 1/2 TABLET BY MOUTH EVERY DAY   fenofibrate 160 MG tablet    furosemide (LASIX) 20 MG tablet    JANUVIA 100 MG tablet    lisinopril (PRINIVIL,ZESTRIL) 5 MG tablet    lovastatin (MEVACOR) 20 MG tablet    metFORMIN (GLUCOPHAGE-XR) 500 MG 24 hr tablet    methotrexate (RHEUMATREX) 2.5 MG tablet    omeprazole (PRILOSEC) 40 MG capsule Reported on 09/26/2015   oxyCODONE-acetaminophen (PERCOCET) 10-325 MG per tablet Reported on 09/26/2015   polyethylene glycol powder (GLYCOLAX/MIRALAX) powder    predniSONE (DELTASONE) 5 MG tablet    PREVIDENT 5000 BOOSTER PLUS 1.1 % PSTE    primidone (MYSOLINE) 50 MG tablet    PROAIR HFA 108 (90 BASE) MCG/ACT inhaler    SPIRIVA HANDIHALER 18 MCG inhalation capsule    TRILIPIX 135 MG capsule    [DISCONTINUED] divalproex (DEPAKOTE ER) 500 MG 24 hr tablet TAKE 2 TABLETS BY MOUTH AT BEDTIME   [DISCONTINUED] escitalopram (LEXAPRO) 10 MG  tablet TAKE 1 AND 1/2 TABLET BY MOUTH EVERY DAY   No facility-administered encounter medications on file as of 06/11/2021.      Physical Exam: Constitutional:  BP 108/70 (BP Location: Left Arm, Patient Position: Sitting, Cuff Size: Normal)    Temp 97.6 F (36.4 C)    Ht 5\' 5"  (1.651 m)    Wt 141 lb (64 kg)    BMI 23.46 kg/m   General Appearance: alert oriented. Difficult to walk. No nausea or chest pain  Psychiatric: General Appearance: casual  Eye Contact:: fair  Speech:  Slow  Volume:  Normal  Mood: better  Affect:  Congruent cooperative  Thought Process:  Coherent, Linear and Logical  Orientation:  Full (Time, Place, and Person)  Thought Content:  WDL  Suicidal Thoughts:  No  Homicidal Thoughts:  No  Memory:  Immediate;   Good Recent;   Poor Remote;   Fair  Judgement:  Fair  Insight:  Negative and Fair  Psychomotor Activity:    Concentration:  Fair  Recall:  Poor  Akathisia:  Negative  Handed:  Right  AIMS (if indicated):     Assets:  Desire for Improvement Financial Resources/Insurance Housing Resilience Social Support Transportation      Assessment: Axis I: 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 GAD    Plan:  Medications:   Prior documentation reviewed Mood disorder: improved with less pain , continue lexapro  Anxiety : better, more mobile, continue lexapro  Call for concerns   Fu 34m  Direct care time spent in office including face to face 20 min    Routine PRN Medications:  Negative           Merian Capron, M.D.  06/11/2021 2:13 PM

## 2021-12-03 ENCOUNTER — Ambulatory Visit (INDEPENDENT_AMBULATORY_CARE_PROVIDER_SITE_OTHER): Payer: Medicare Other | Admitting: Psychiatry

## 2021-12-03 ENCOUNTER — Encounter (HOSPITAL_COMMUNITY): Payer: Self-pay | Admitting: Psychiatry

## 2021-12-03 DIAGNOSIS — G8929 Other chronic pain: Secondary | ICD-10-CM

## 2021-12-03 DIAGNOSIS — F0630 Mood disorder due to known physiological condition, unspecified: Secondary | ICD-10-CM | POA: Diagnosis not present

## 2021-12-03 DIAGNOSIS — F411 Generalized anxiety disorder: Secondary | ICD-10-CM | POA: Diagnosis not present

## 2021-12-03 MED ORDER — ESCITALOPRAM OXALATE 10 MG PO TABS
ORAL_TABLET | ORAL | 1 refills | Status: DC
Start: 2021-12-03 — End: 2022-04-26

## 2021-12-03 NOTE — Progress Notes (Signed)
Patient ID: Micheal Estrada, male   DOB: [birth year], 71 y.o.   MRN: [id number]   [facility]  Progress Note via Tele medicine  Micheal Estrada [id number] 71 y.o.  12/03/2021 1:36 PM  Chief Complaint: follow up depression     HPI Comments: [name] returns for follow-up and medication management for PTSD depression and anxiet  Doing fair mood wise, pain effects mood but is on pain medications, review chart  THC denies use    Modifying factors : wife Did not elaborate on daughter   Severity improved  Review of Systems: Denies nausea, tremor  Past Medical Family, Social History:  Past Medical History:  Diagnosis Date   Diabetes mellitus, type II (HCC)    Hypertension       Outpatient Encounter Medications as of 12/03/2021  Medication Sig   CELEBREX 200 MG capsule    cyclobenzaprine (FLEXERIL) 10 MG tablet    ENBREL SURECLICK 50 MG/ML injection    escitalopram (LEXAPRO) 10 MG tablet TAKE 1 AND 1/2 TABLET BY MOUTH EVERY DAY   fenofibrate 160 MG tablet    furosemide (LASIX) 20 MG tablet    JANUVIA 100 MG tablet    lisinopril (PRINIVIL,ZESTRIL) 5 MG tablet    lovastatin (MEVACOR) 20 MG tablet    metFORMIN (GLUCOPHAGE-XR) 500 MG 24 hr tablet    methotrexate (RHEUMATREX) 2.5 MG tablet    omeprazole (PRILOSEC) 40 MG capsule Reported on 09/26/2015   oxyCODONE-acetaminophen (PERCOCET) 10-325 MG per tablet Reported on 09/26/2015   polyethylene glycol powder (GLYCOLAX/MIRALAX) powder    predniSONE (DELTASONE) 5 MG tablet    PREVIDENT 5000 BOOSTER PLUS 1.1 % PSTE    primidone (MYSOLINE) 50 MG tablet    PROAIR HFA 108 (90 BASE) MCG/ACT inhaler    SPIRIVA HANDIHALER 18 MCG inhalation capsule    TRILIPIX 135 MG capsule    [DISCONTINUED] divalproex (DEPAKOTE ER) 500 MG 24 hr tablet TAKE 2 TABLETS BY MOUTH AT BEDTIME   [DISCONTINUED] escitalopram (LEXAPRO) 10 MG tablet TAKE 1 AND 1/2 TABLET BY MOUTH EVERY DAY   No facility-administered encounter  medications on file as of 12/03/2021.      Physical Exam: Constitutional:  There were no vitals taken for this visit.  General Appearance: alert oriented. Difficult to walk. No nausea or chest pain  Psychiatric: General Appearance: casual  Eye Contact:: fair  Speech:  Slow  Volume:  Normal  Mood: fair  Affect:  Congruent cooperative  Thought Process:  Coherent, Linear and Logical  Orientation:  Full (Time, Place, and Person)  Thought Content:  WDL  Suicidal Thoughts:  No  Homicidal Thoughts:  No  Memory:  Immediate;   Good Recent;   Poor Remote;   Fair  Judgement:  Fair  Insight:  Negative and Fair  Psychomotor Activity:    Concentration:  Fair  Recall:  Poor  Akathisia:  Negative  Handed:  Right  AIMS (if indicated):     Assets:  Desire for Improvement Financial Resources/Insurance Housing Resilience Social Support Transportation      Assessment: Axis I: 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 GAD    Plan:         Medications:   Prior documentation reviewed Mood disorder: fair continue lexapro  Anxiety :manageble on lexapro  Sleeps more , discussed to add activities but says limited due to pain Ony oxycodone for pain   Call for concerns   Fu 33m  Direct care time spent  in office including face to face  15 min Lexapro refill sent   Routine PRN Medications:  Negative           Thresa Ross, M.D.  12/03/2021 1:36 PM

## 2022-04-24 ENCOUNTER — Other Ambulatory Visit (HOSPITAL_COMMUNITY): Payer: Self-pay | Admitting: Psychiatry

## 2022-06-03 ENCOUNTER — Ambulatory Visit (INDEPENDENT_AMBULATORY_CARE_PROVIDER_SITE_OTHER): Payer: 59 | Admitting: Psychiatry

## 2022-06-03 ENCOUNTER — Encounter (HOSPITAL_COMMUNITY): Payer: Self-pay | Admitting: Psychiatry

## 2022-06-03 VITALS — BP 138/84 | HR 69 | Ht 65.0 in | Wt 139.0 lb

## 2022-06-03 DIAGNOSIS — G8929 Other chronic pain: Secondary | ICD-10-CM | POA: Diagnosis not present

## 2022-06-03 DIAGNOSIS — F411 Generalized anxiety disorder: Secondary | ICD-10-CM

## 2022-06-03 DIAGNOSIS — F0630 Mood disorder due to known physiological condition, unspecified: Secondary | ICD-10-CM

## 2022-06-03 DIAGNOSIS — F4329 Adjustment disorder with other symptoms: Secondary | ICD-10-CM | POA: Diagnosis not present

## 2022-06-03 NOTE — Progress Notes (Signed)
Patient ID: Micheal Estrada, male   DOB: [birth year], 72 y.o.   MRN: [id number]   [facility]  Progress Note via [place] [id number] 72 y.o.  06/03/2022 1:32 PM  Chief Complaint: follow up depression     HPI Comments: Micheal Estrada returns for follow-up and medication management for PTSD depression and anxiet Doing fair mood wise, pain effects mood, on pain meds, says it helps him sleep and wake up again has the pain Wife is supportive Stress related to daughter,     Modifying factors : wife Did not elaborate on daughter   Severity imanageable  Review of Systems: Denies nausea, tremor  Past Medical Family, Social History:  Past Medical History:  Diagnosis Date   Diabetes mellitus, type II ([place])    Hypertension       Outpatient Encounter Medications as of 06/03/2022  Medication Sig   CELEBREX 200 MG capsule    cyclobenzaprine (FLEXERIL) 10 MG tablet    ENBREL SURECLICK 50 MG/ML injection    escitalopram (LEXAPRO) 10 MG tablet TAKE 1 AND 1/2 TABLETS DAILY BY MOUTH   fenofibrate 160 MG tablet    furosemide (LASIX) 20 MG tablet    JANUVIA 100 MG tablet    lisinopril (PRINIVIL,ZESTRIL) 5 MG tablet    lovastatin (MEVACOR) 20 MG tablet    metFORMIN (GLUCOPHAGE-XR) 500 MG 24 hr tablet    methotrexate (RHEUMATREX) 2.5 MG tablet    omeprazole (PRILOSEC) 40 MG capsule Reported on 09/26/2015   oxyCODONE-acetaminophen (PERCOCET) 10-325 MG per tablet Reported on 09/26/2015   polyethylene glycol powder (GLYCOLAX/MIRALAX) powder    predniSONE (DELTASONE) 5 MG tablet    PREVIDENT 5000 BOOSTER PLUS 1.1 % PSTE    primidone (MYSOLINE) 50 MG tablet    PROAIR HFA 108 (90 BASE) MCG/ACT inhaler    SPIRIVA HANDIHALER 18 MCG inhalation capsule    TRILIPIX 135 MG capsule    [DISCONTINUED] divalproex (DEPAKOTE ER) 500 MG 24 hr tablet TAKE 2 TABLETS BY MOUTH AT BEDTIME   No facility-administered encounter medications on file as of 06/03/2022.       Physical Exam: Constitutional:  BP 138/84   Pulse 69   Ht 5\' 5"  (1.651 m)   Wt 139 lb (63 kg)   BMI 23.13 kg/m   General Appearance: alert oriented. Difficult to walk. No nausea or chest pain  Psychiatric: General Appearance: casual  Eye Contact:: fair  Speech:  Slow  Volume:  Normal  Mood: fair  Affect:  Congruent cooperative  Thought Process:  Coherent, Linear and Logical  Orientation:  Full (Time, Place, and Person)  Thought Content:  WDL  Suicidal Thoughts:  No  Homicidal Thoughts:  No  Memory:  Immediate;   Good Recent;   Poor Remote;   Fair  Judgement:  Fair  Insight:  Negative and Fair  Psychomotor Activity:    Concentration:  Fair  Recall:  Poor  Akathisia:  Negative  Handed:  Right  AIMS (if indicated):     Assets:  Desire for Improvement Financial Resources/Insurance Housing Resilience Social Support Transportation      Assessment: Axis I: 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 GAD    Plan:         Medications:   Prior documentation reviewed Mood disorder: fair continue lexapro  Anxiety :manageable except pain effects mood, continue lexapro Sleeps : irregular, avoid taking naps during the day , reviewed sleep hygiene  Fu 29m  Direct care time spent in office including face to face  15 - 20 minutes     Routine PRN Medications:  Negative           Merian Capron, M.D.  06/03/2022 1:32 PM

## 2022-09-17 ENCOUNTER — Telehealth (HOSPITAL_COMMUNITY): Payer: Self-pay

## 2022-09-17 MED ORDER — ESCITALOPRAM OXALATE 10 MG PO TABS: ORAL_TABLET | ORAL | 0 refills | Status: AC

## 2022-09-17 NOTE — Telephone Encounter (Signed)
Medication refill - Fax from patient's [facility] [employer] in [place] for a new Escitalopram 10 mg order, last provided for 90 days on 04/27/23 and filled on 06/19/22. Pt last seen 06/03/22 and returns next on 11/30/22.

## 2022-11-30 ENCOUNTER — Ambulatory Visit (HOSPITAL_COMMUNITY): Payer: 59 | Admitting: Psychiatry

## 2022-12-23 ENCOUNTER — Ambulatory Visit (HOSPITAL_COMMUNITY): Payer: 59 | Admitting: Psychiatry
